# Patient Record
Sex: Female | Born: 1985 | Hispanic: Yes | Marital: Married | Smoking: Never smoker
Health system: Southern US, Community
[De-identification: ages and names within clinical notes are randomized; demographics above are authoritative.]

## PROBLEM LIST (undated history)

## (undated) ENCOUNTER — Inpatient Hospital Stay (HOSPITAL_COMMUNITY): Payer: Self-pay

## (undated) DIAGNOSIS — Z789 Other specified health status: Secondary | ICD-10-CM

## (undated) HISTORY — PX: NO PAST SURGERIES: SHX2092

---

## 2018-01-11 NOTE — L&D Delivery Note (Signed)
Delivery Note At 1:04 PM a viable and healthy female was delivered via Vaginal, Spontaneous (Presentation:vtx ;ROA  ).  APGAR: 9, 9; weight 6 lb 13.7 oz (3110 g).   Placenta status:spontaneous intact.( After 1 hr ), .  Cord:  with the following complications:  True knot x 1.  Cord pH: n/a  Anesthesia:  Epidural Episiotomy: None Lacerations:  Bilateral Vaginal;2nd degree perineal Suture Repair: 3.0 chromic Est. Blood Loss (mL): 240 cytotec 800 mcg PR given Methergine 0.2 mg IM for boggy uterus  Mom to postpartum.  Baby to Couplet care / Skin to Skin.  Felicita Nuncio A Shlomie Romig 09/26/2018, 8:01 PM

## 2018-02-15 ENCOUNTER — Encounter (HOSPITAL_COMMUNITY): Payer: Self-pay | Admitting: *Deleted

## 2018-02-15 ENCOUNTER — Inpatient Hospital Stay (HOSPITAL_COMMUNITY)
Admission: AD | Admit: 2018-02-15 | Discharge: 2018-02-15 | Disposition: A | Discharge status: Home / Self Care | Payer: Managed Care, Other (non HMO) | Attending: Obstetrics and Gynecology | Admitting: Obstetrics and Gynecology

## 2018-02-15 ENCOUNTER — Inpatient Hospital Stay (HOSPITAL_COMMUNITY): Payer: Managed Care, Other (non HMO)

## 2018-02-15 DIAGNOSIS — O208 Other hemorrhage in early pregnancy: Secondary | ICD-10-CM | POA: Diagnosis present

## 2018-02-15 DIAGNOSIS — Z3491 Encounter for supervision of normal pregnancy, unspecified, first trimester: Secondary | ICD-10-CM

## 2018-02-15 DIAGNOSIS — O468X1 Other antepartum hemorrhage, first trimester: Secondary | ICD-10-CM

## 2018-02-15 DIAGNOSIS — Z3A08 8 weeks gestation of pregnancy: Secondary | ICD-10-CM | POA: Diagnosis not present

## 2018-02-15 DIAGNOSIS — O26891 Other specified pregnancy related conditions, first trimester: Secondary | ICD-10-CM | POA: Diagnosis not present

## 2018-02-15 DIAGNOSIS — O418X10 Other specified disorders of amniotic fluid and membranes, first trimester, not applicable or unspecified: Secondary | ICD-10-CM | POA: Diagnosis not present

## 2018-02-15 DIAGNOSIS — O209 Hemorrhage in early pregnancy, unspecified: Secondary | ICD-10-CM

## 2018-02-15 DIAGNOSIS — Z7982 Long term (current) use of aspirin: Secondary | ICD-10-CM | POA: Diagnosis not present

## 2018-02-15 DIAGNOSIS — R102 Pelvic and perineal pain: Secondary | ICD-10-CM | POA: Diagnosis not present

## 2018-02-15 HISTORY — DX: Other specified health status: Z78.9

## 2018-02-15 LAB — URINALYSIS, ROUTINE W REFLEX MICROSCOPIC
Bilirubin Urine: NEGATIVE
Glucose, UA: NEGATIVE mg/dL
Ketones, ur: NEGATIVE mg/dL
Nitrite: NEGATIVE
Protein, ur: NEGATIVE mg/dL
SPECIFIC GRAVITY, URINE: 1.01 (ref 1.005–1.030)
pH: 5.5 (ref 5.0–8.0)

## 2018-02-15 LAB — URINALYSIS, MICROSCOPIC (REFLEX)

## 2018-02-15 LAB — POCT PREGNANCY, URINE: Preg Test, Ur: POSITIVE — AB

## 2018-02-15 LAB — CBC
HCT: 39.9 % (ref 36.0–46.0)
Hemoglobin: 14 g/dL (ref 12.0–15.0)
MCH: 31.5 pg (ref 26.0–34.0)
MCHC: 35.1 g/dL (ref 30.0–36.0)
MCV: 89.7 fL (ref 80.0–100.0)
Platelets: 213 10*3/uL (ref 150–400)
RBC: 4.45 MIL/uL (ref 3.87–5.11)
RDW: 12.2 % (ref 11.5–15.5)
WBC: 10.4 10*3/uL (ref 4.0–10.5)
nRBC: 0 % (ref 0.0–0.2)

## 2018-02-15 LAB — WET PREP, GENITAL
Clue Cells Wet Prep HPF POC: NONE SEEN
Sperm: NONE SEEN
Trich, Wet Prep: NONE SEEN
Yeast Wet Prep HPF POC: NONE SEEN

## 2018-02-15 LAB — HCG, QUANTITATIVE, PREGNANCY: hCG, Beta Chain, Quant, S: 92974 m[IU]/mL — ABNORMAL HIGH (ref <5)

## 2018-02-15 LAB — ABO/RH: ABO/RH(D): O POS

## 2018-02-15 NOTE — MAU Provider Note (Signed)
Chief Complaint: Possible Pregnancy and Vaginal Bleeding   None     SUBJECTIVE HPI: Diana Good is a 33 y.o. G1P0 at 4162w5d by LMP who presents to maternity admissions reporting vaginal bleeding with onset today. She reports the bleeding is red, in her underwear and when wiping but has not required a pad. There is no pain.  There are no other symptoms. She has not tried any treatments. She had 2 ultrasounds in [place], at 6818w3d and at 5378w3d, which she shows pictures of on her phone, indicating a live IUP.   HPI  Past Medical History:  Diagnosis Date  . Medical history non-contributory    Past Surgical History:  Procedure Laterality Date  . NO PAST SURGERIES     Social History   Socioeconomic History  . Marital status: Married    Spouse name: Not on file  . Number of children: Not on file  . Years of education: Not on file  . Highest education level: Not on file  Occupational History  . Not on file  Social Needs  . Financial resource strain: Not on file  . Food insecurity:    Worry: Not on file    Inability: Not on file  . Transportation needs:    Medical: Not on file    Non-medical: Not on file  Tobacco Use  . Smoking status: Never Smoker  . Smokeless tobacco: Never Used  Substance and Sexual Activity  . Alcohol use: Not Currently  . Drug use: Never  . Sexual activity: Yes  Lifestyle  . Physical activity:    Days per week: Not on file    Minutes per session: Not on file  . Stress: Not on file  Relationships  . Social connections:    Talks on phone: Not on file    Gets together: Not on file    Attends religious service: Not on file    Active member of club or organization: Not on file    Attends meetings of clubs or organizations: Not on file    Relationship status: Not on file  . Intimate partner violence:    Fear of current or ex partner: Not on file    Emotionally abused: Not on file    Physically abused: Not on file    Forced sexual activity:  Not on file  Other Topics Concern  . Not on file  Social History Narrative  . Not on file   No current facility-administered medications on file prior to encounter.    Current Outpatient Medications on File Prior to Encounter  Medication Sig Dispense Refill  . aspirin EC 81 MG tablet Take 81 mg by mouth daily.    . Prenatal Vit-Fe Fumarate-FA (PRENATAL MULTIVITAMIN) TABS tablet Take 1 tablet by mouth daily at 12 noon.    Marland Kitchen. PRESCRIPTION MEDICATION Take 1 tablet by mouth daily as needed (For nausea.).    Marland Kitchen. Progesterone Micronized (PROGESTERONE PO) Take 1 capsule by mouth at bedtime.     No Known Allergies  ROS:  Review of Systems   I have reviewed patient's Past Medical Hx, Surgical Hx, Family Hx, Social Hx, medications and allergies.   Physical Exam   Patient Vitals for the past 24 hrs:  BP Temp Temp src Pulse Resp SpO2 Height Weight  02/15/18 1128 (!) 109/58 98.4 F (36.9 C) Oral 77 15 99 % 5\' 3"  (1.6 m) 59.9 kg   Constitutional: Well-developed, well-nourished female in no acute distress.  Cardiovascular: normal rate Respiratory: normal  effort GI: Abd soft, non-tender. Pos BS x 4 MS: Extremities nontender, no edema, normal ROM Neurologic: Alert and oriented x 4.  GU: Neg CVAT.  PELVIC EXAM: vaginal swabs collected as blind swab, pad count with no bleeding on pad in MAU   LAB RESULTS Results for orders placed or performed during the hospital encounter of 02/15/18 (from the past 24 hour(s))  Pregnancy, urine POC     Status: Abnormal   Collection Time: 02/15/18 11:26 AM  Result Value Ref Range   Preg Test, Ur POSITIVE (A) NEGATIVE  Urinalysis, Routine w reflex microscopic     Status: Abnormal   Collection Time: 02/15/18 11:28 AM  Result Value Ref Range   Color, Urine YELLOW YELLOW   APPearance CLEAR CLEAR   Specific Gravity, Urine 1.010 1.005 - 1.030   pH 5.5 5.0 - 8.0   Glucose, UA NEGATIVE NEGATIVE mg/dL   Hgb urine dipstick LARGE (A) NEGATIVE   Bilirubin Urine  NEGATIVE NEGATIVE   Ketones, ur NEGATIVE NEGATIVE mg/dL   Protein, ur NEGATIVE NEGATIVE mg/dL   Nitrite NEGATIVE NEGATIVE   Leukocytes, UA MODERATE (A) NEGATIVE  Urinalysis, Microscopic (reflex)     Status: Abnormal   Collection Time: 02/15/18 11:28 AM  Result Value Ref Range   RBC / HPF 0-5 0 - 5 RBC/hpf   WBC, UA 11-20 0 - 5 WBC/hpf   Bacteria, UA FEW (A) NONE SEEN   Squamous Epithelial / LPF 0-5 0 - 5   Mucus PRESENT   CBC     Status: None   Collection Time: 02/15/18 12:00 PM  Result Value Ref Range   WBC 10.4 4.0 - 10.5 K/uL   RBC 4.45 3.87 - 5.11 MIL/uL   Hemoglobin 14.0 12.0 - 15.0 g/dL   HCT 67.8 93.8 - 10.1 %   MCV 89.7 80.0 - 100.0 fL   MCH 31.5 26.0 - 34.0 pg   MCHC 35.1 30.0 - 36.0 g/dL   RDW 75.1 02.5 - 85.2 %   Platelets 213 150 - 400 K/uL   nRBC 0.0 0.0 - 0.2 %  hCG, quantitative, pregnancy     Status: Abnormal   Collection Time: 02/15/18 12:00 PM  Result Value Ref Range   hCG, Beta Chain, Quant, S 92,974 (H) <5 mIU/mL  ABO/Rh     Status: None (Preliminary result)   Collection Time: 02/15/18 12:00 PM  Result Value Ref Range   ABO/RH(D)      O POS Performed at [facility], [address] [place], [place] [address]   Wet prep, genital     Status: Abnormal   Collection Time: 02/15/18 12:06 PM  Result Value Ref Range   Yeast Wet Prep HPF POC NONE SEEN NONE SEEN   Trich, Wet Prep NONE SEEN NONE SEEN   Clue Cells Wet Prep HPF POC NONE SEEN NONE SEEN   WBC, Wet Prep HPF POC MANY (A) NONE SEEN   Sperm NONE SEEN     --/--/O POS Performed at [facility], [address] [place], [place] [address]  (02/05 1200)  IMAGING US Ob Comp Less 14 Wks  Result Date: 02/15/2018 CLINICAL DATA:  Vaginal bleeding in early pregnancy EXAM: OBSTETRIC <14 WK ULTRASOUND TECHNIQUE: Transabdominal ultrasound was performed for evaluation of the gestation as well as the maternal uterus and adnexal regions. COMPARISON:  None. FINDINGS: Intrauterine gestational sac: Single  Yolk sac:  Visualized Embryo:  Visualized Cardiac Activity: Visualized Heart Rate: 169 bpm CRL:   22.4 mm   8  w 6 d                  [place] EDC: 09/21/2018 Subchorionic hemorrhage:  Small measuring 1.34 cm Maternal uterus/adnexae: Right ovary: Normal Left ovary: Normal Other :None Free fluid:  None IMPRESSION: 1. Single living intrauterine gestation with an estimated gestational age of [redacted] weeks and 6 days. This is concordant with the gestational age by last menstrual period. 2. Small subchorionic hemorrhage identified. Electronically Signed   By: Signa Kell M.D.   On: 02/15/2018 13:25    MAU Management/MDM: Orders Placed This Encounter  Procedures  . Wet prep, genital  . US OB Comp Less 14 Wks  . Urinalysis, Routine w reflex microscopic  . CBC  . hCG, quantitative, pregnancy  . Urinalysis, Microscopic (reflex)  . Nursing communication  . Pregnancy, urine POC  . ABO/Rh  . Discharge patient    No orders of the defined types were placed in this encounter.   US shows live IUP with small subchorionic hemorrhage.  Discussed results with pt and her husband. Questions answered. Pt plans to begin prenatal care with [facility]. Call for prenatal appointment to begin care.  Return to MAU with any heavy bleeding or abdominal pain.   Pt discharged with strict return precautions.  ASSESSMENT 1. Normal IUP (intrauterine pregnancy) on prenatal ultrasound, first trimester   2. Pelvic pain in pregnancy, antepartum, first trimester   3. Vaginal bleeding in pregnancy, first trimester   4. Subchorionic hemorrhage of placenta in first trimester, single or unspecified fetus     PLAN Discharge home Allergies as of 02/15/2018   No Known Allergies     Medication List    TAKE these medications   aspirin EC 81 MG tablet Take 81 mg by mouth daily.   prenatal multivitamin Tabs tablet Take 1 tablet by mouth daily at 12 noon.   PRESCRIPTION MEDICATION Take 1 tablet by mouth daily as needed (For  nausea.).   PROGESTERONE PO Take 1 capsule by mouth at bedtime.      Follow-up Information    [facility] Follow up.   Why:  Or prenatal provider of your choice, see list provided. Contact information: [address] [place] [address] [phone]           Sharen Counter Certified Nurse-Midwife 02/15/2018  1:51 PM

## 2018-02-15 NOTE — Discharge Instructions (Signed)
[facility] Providers  ° ° °[facility]       Phone: [phone] ° °[facility] Phone: [phone] ° °[facility]  Phone: [phone] ° °[facility]  Phone: [phone] ° °[facility]  Phone: [phone] ° °[facility]       Phone: [phone] ° °[facility]    Phone: [phone]  ° °[facility]    Phone: [phone] ° °[facility]    Phone: [phone] ° °[facility]    Phone: [phone] ° °[facility]    Phone: [phone] ° °[facility]       Phone: [phone]  ° °[facility]  Phone: [phone] ° °[facility]    Phone: [phone] ° °[facility]   Phone: [phone] ° °[facility]      Phone: [phone] ° °[facility]    Phone: [phone] ° °

## 2018-02-15 NOTE — MAU Note (Signed)
Pt reports she is pregnant, positive preg test in [place]. Has only been in the [place] for one week. Started having bleeding like a period today. Denies pain,.

## 2018-02-15 NOTE — MAU Note (Signed)
Pt states she had some spotting 3 weeks ago in [place], her dr gave her oral progesterone which she is still taking, has 6 doses left.

## 2018-02-16 LAB — GC/CHLAMYDIA PROBE AMP (~~LOC~~) NOT AT ARMC
Chlamydia: NEGATIVE
Neisseria Gonorrhea: NEGATIVE

## 2018-03-03 LAB — OB RESULTS CONSOLE GC/CHLAMYDIA
Chlamydia: NEGATIVE
Gonorrhea: NEGATIVE

## 2018-03-03 LAB — OB RESULTS CONSOLE RPR: RPR: NONREACTIVE

## 2018-03-03 LAB — OB RESULTS CONSOLE ANTIBODY SCREEN: Antibody Screen: NEGATIVE

## 2018-03-03 LAB — OB RESULTS CONSOLE RUBELLA ANTIBODY, IGM: Rubella: IMMUNE

## 2018-03-03 LAB — OB RESULTS CONSOLE HIV ANTIBODY (ROUTINE TESTING): HIV: NONREACTIVE

## 2018-03-03 LAB — OB RESULTS CONSOLE ABO/RH: RH Type: POSITIVE

## 2018-03-03 LAB — OB RESULTS CONSOLE HEPATITIS B SURFACE ANTIGEN: Hepatitis B Surface Ag: NEGATIVE

## 2018-09-01 LAB — OB RESULTS CONSOLE GBS: GBS: NEGATIVE

## 2018-09-21 ENCOUNTER — Telehealth (HOSPITAL_COMMUNITY): Payer: Self-pay | Admitting: *Deleted

## 2018-09-21 NOTE — Telephone Encounter (Signed)
Preadmission screen  

## 2018-09-22 ENCOUNTER — Telehealth (HOSPITAL_COMMUNITY): Payer: Self-pay | Admitting: *Deleted

## 2018-09-22 ENCOUNTER — Encounter (HOSPITAL_COMMUNITY): Payer: Self-pay | Admitting: *Deleted

## 2018-09-22 NOTE — Telephone Encounter (Signed)
Preadmission screen  

## 2018-09-25 ENCOUNTER — Other Ambulatory Visit: Payer: Self-pay

## 2018-09-25 ENCOUNTER — Other Ambulatory Visit: Payer: Self-pay | Admitting: Obstetrics

## 2018-09-25 ENCOUNTER — Encounter (HOSPITAL_COMMUNITY): Payer: Self-pay

## 2018-09-25 ENCOUNTER — Inpatient Hospital Stay (EMERGENCY_DEPARTMENT_HOSPITAL)
Admission: AD | Admit: 2018-09-25 | Discharge: 2018-09-25 | Disposition: A | Discharge status: Home / Self Care | Payer: Managed Care, Other (non HMO) | Source: Home / Self Care | Attending: Obstetrics and Gynecology | Admitting: Obstetrics and Gynecology

## 2018-09-25 DIAGNOSIS — Z3A40 40 weeks gestation of pregnancy: Secondary | ICD-10-CM | POA: Diagnosis not present

## 2018-09-25 DIAGNOSIS — O480 Post-term pregnancy: Secondary | ICD-10-CM

## 2018-09-25 DIAGNOSIS — O471 False labor at or after 37 completed weeks of gestation: Secondary | ICD-10-CM | POA: Diagnosis not present

## 2018-09-25 NOTE — MAU Note (Signed)
PT SAYS UC STARTED MN. PNC WITH DR [facility]-  TODAY VE - 3-4 CM.    DENIES HSV AND MRSA.  GBS-  NEG

## 2018-09-25 NOTE — MAU Note (Signed)
Patient scheduled for induction this Thursday 09/28/18.

## 2018-09-25 NOTE — MAU Note (Signed)
I have communicated with Sunday Corn, CNM and reviewed vital signs:  Vitals:   09/25/18 2042 09/25/18 2305  BP: 117/71 105/63  Pulse: 74 67  Resp: 20 17  Temp: 99.2 F (37.3 C) 98.4 F (36.9 C)  SpO2:  100%    Vaginal exam:  Dilation: 4 Effacement (%): 70, 80 Cervical Position: Middle Station: -1 Presentation: Vertex Exam by:: Denyse Dago, RN,   Also reviewed contraction pattern and that non-stress test is reactive.  It has been documented that patient is contracting every 2-5 minutes with no cervical change over 1 hours not indicating active labor.  Patient denies any other complaints.  Based on this report provider has given order for discharge.  A discharge order and diagnosis entered by a provider.   Labor discharge instructions reviewed with patient.

## 2018-09-25 NOTE — MAU Provider Note (Signed)
Ms. Diana Good is a G1P0 at [redacted]w[redacted]d seen in MAU for labor. RN labor check, not seen by provider.  SVE by RN Dilation: 4 Effacement (%): 70, 80 Cervical Position: Middle Station: -1 Presentation: Vertex Exam by:: Denyse Dago, RN  Unchanged are cervical recheck  NST - FHR: 145 bpm / moderate variability / accels present / decels absent / TOCO: regular every 3-4 mins   Assessment/Plan: 1. False Labor D/C home with labor precautions Keep scheduled appt for IOL on Thursday 09/28/2018  Laury Deep, CNM  09/25/2018 10:48 PM

## 2018-09-26 ENCOUNTER — Inpatient Hospital Stay (HOSPITAL_COMMUNITY): Payer: Managed Care, Other (non HMO) | Admitting: Anesthesiology

## 2018-09-26 ENCOUNTER — Other Ambulatory Visit (HOSPITAL_COMMUNITY)
Admission: RE | Admit: 2018-09-26 | Discharge: 2018-09-26 | Disposition: A | Discharge status: Home / Self Care | Payer: Managed Care, Other (non HMO) | Source: Ambulatory Visit

## 2018-09-26 ENCOUNTER — Encounter (HOSPITAL_COMMUNITY): Payer: Self-pay | Admitting: *Deleted

## 2018-09-26 ENCOUNTER — Inpatient Hospital Stay (HOSPITAL_COMMUNITY)
Admission: AD | Admit: 2018-09-26 | Discharge: 2018-09-28 | DRG: 805 | Disposition: A | Discharge status: Home / Self Care | Payer: Managed Care, Other (non HMO) | Attending: Obstetrics and Gynecology | Admitting: Obstetrics and Gynecology

## 2018-09-26 DIAGNOSIS — O411230 Chorioamnionitis, third trimester, not applicable or unspecified: Secondary | ICD-10-CM | POA: Diagnosis present

## 2018-09-26 DIAGNOSIS — O9081 Anemia of the puerperium: Secondary | ICD-10-CM | POA: Diagnosis not present

## 2018-09-26 DIAGNOSIS — O480 Post-term pregnancy: Principal | ICD-10-CM | POA: Diagnosis present

## 2018-09-26 DIAGNOSIS — Z3A40 40 weeks gestation of pregnancy: Secondary | ICD-10-CM | POA: Diagnosis not present

## 2018-09-26 DIAGNOSIS — Z20828 Contact with and (suspected) exposure to other viral communicable diseases: Secondary | ICD-10-CM | POA: Diagnosis present

## 2018-09-26 DIAGNOSIS — O471 False labor at or after 37 completed weeks of gestation: Secondary | ICD-10-CM

## 2018-09-26 LAB — CBC
HCT: 38.7 % (ref 36.0–46.0)
Hemoglobin: 13.4 g/dL (ref 12.0–15.0)
MCH: 30.7 pg (ref 26.0–34.0)
MCHC: 34.6 g/dL (ref 30.0–36.0)
MCV: 88.6 fL (ref 80.0–100.0)
Platelets: 181 10*3/uL (ref 150–400)
RBC: 4.37 MIL/uL (ref 3.87–5.11)
RDW: 14.2 % (ref 11.5–15.5)
WBC: 13.8 10*3/uL — ABNORMAL HIGH (ref 4.0–10.5)
nRBC: 0 % (ref 0.0–0.2)

## 2018-09-26 LAB — RPR: RPR Ser Ql: NONREACTIVE

## 2018-09-26 LAB — TYPE AND SCREEN
ABO/RH(D): O POS
Antibody Screen: NEGATIVE

## 2018-09-26 LAB — ABO/RH: ABO/RH(D): O POS

## 2018-09-26 LAB — SARS CORONAVIRUS 2 BY RT PCR (HOSPITAL ORDER, PERFORMED IN ~~LOC~~ HOSPITAL LAB): SARS Coronavirus 2: NEGATIVE

## 2018-09-26 MED ORDER — FENTANYL-BUPIVACAINE-NACL 0.5-0.125-0.9 MG/250ML-% EP SOLN
EPIDURAL | Status: AC
  Filled 2018-09-26: qty 250

## 2018-09-26 MED ORDER — TETANUS-DIPHTH-ACELL PERTUSSIS 5-2.5-18.5 LF-MCG/0.5 IM SUSP: 0.5000 mL | Freq: Once | INTRAMUSCULAR | Status: DC

## 2018-09-26 MED ORDER — ONDANSETRON HCL 4 MG/2ML IJ SOLN: 4.0000 mg | Freq: Four times a day (QID) | INTRAMUSCULAR | Status: DC | PRN

## 2018-09-26 MED ORDER — LACTATED RINGERS IV SOLN
INTRAVENOUS | Status: DC
  Administered 2018-09-26 (×2): via INTRAVENOUS

## 2018-09-26 MED ORDER — FENTANYL-BUPIVACAINE-NACL 0.5-0.125-0.9 MG/250ML-% EP SOLN: 12.0000 mL/h | EPIDURAL | Status: DC | PRN

## 2018-09-26 MED ORDER — DIPHENHYDRAMINE HCL 50 MG/ML IJ SOLN: 12.5000 mg | INTRAMUSCULAR | Status: DC | PRN

## 2018-09-26 MED ORDER — CLINDAMYCIN PHOSPHATE 900 MG/50ML IV SOLN
900.0000 mg | Freq: Three times a day (TID) | INTRAVENOUS | Status: DC
  Administered 2018-09-26 – 2018-09-27 (×3): 900 mg via INTRAVENOUS
  Filled 2018-09-26 (×5): qty 50

## 2018-09-26 MED ORDER — OXYTOCIN 10 UNIT/ML IJ SOLN: 10.0000 [IU] | Freq: Once | INTRAMUSCULAR | Status: DC

## 2018-09-26 MED ORDER — ACETAMINOPHEN 160 MG/5ML PO SOLN
650.0000 mg | ORAL | Status: DC | PRN
  Filled 2018-09-26: qty 20.3

## 2018-09-26 MED ORDER — OXYCODONE-ACETAMINOPHEN 5-325 MG PO TABS: 1.0000 | ORAL_TABLET | ORAL | Status: DC | PRN

## 2018-09-26 MED ORDER — GENTAMICIN SULFATE 40 MG/ML IJ SOLN
5.0000 mg/kg | INTRAVENOUS | Status: DC
  Administered 2018-09-26: 310 mg via INTRAVENOUS
  Filled 2018-09-26 (×2): qty 7.75

## 2018-09-26 MED ORDER — DIPHENHYDRAMINE HCL 25 MG PO CAPS: 25.0000 mg | ORAL_CAPSULE | Freq: Four times a day (QID) | ORAL | Status: DC | PRN

## 2018-09-26 MED ORDER — OXYTOCIN BOLUS FROM INFUSION
500.0000 mL | Freq: Once | INTRAVENOUS | Status: AC
  Administered 2018-09-26: 500 mL via INTRAVENOUS

## 2018-09-26 MED ORDER — PHENYLEPHRINE 40 MCG/ML (10ML) SYRINGE FOR IV PUSH (FOR BLOOD PRESSURE SUPPORT): 80.0000 ug | PREFILLED_SYRINGE | INTRAVENOUS | Status: DC | PRN

## 2018-09-26 MED ORDER — ACETAMINOPHEN 10 MG/ML IV SOLN
1000.0000 mg | Freq: Once | INTRAVENOUS | Status: AC
  Administered 2018-09-26: 1000 mg via INTRAVENOUS
  Filled 2018-09-26: qty 100

## 2018-09-26 MED ORDER — SODIUM CHLORIDE 0.9 % IV SOLN
2.0000 g | Freq: Four times a day (QID) | INTRAVENOUS | Status: DC
  Administered 2018-09-26 – 2018-09-27 (×4): 2 g via INTRAVENOUS
  Filled 2018-09-26 (×2): qty 2000
  Filled 2018-09-26: qty 2
  Filled 2018-09-26 (×3): qty 2000
  Filled 2018-09-26 (×2): qty 2

## 2018-09-26 MED ORDER — MISOPROSTOL 200 MCG PO TABS: 800.0000 ug | ORAL_TABLET | Freq: Once | ORAL | Status: DC

## 2018-09-26 MED ORDER — MISOPROSTOL 200 MCG PO TABS
ORAL_TABLET | ORAL | Status: AC
  Administered 2018-09-26: 14:00:00 800 ug
  Filled 2018-09-26: qty 4

## 2018-09-26 MED ORDER — DIBUCAINE (PERIANAL) 1 % EX OINT: 1.0000 "application " | TOPICAL_OINTMENT | CUTANEOUS | Status: DC | PRN

## 2018-09-26 MED ORDER — EPHEDRINE 5 MG/ML INJ: 10.0000 mg | INTRAVENOUS | Status: DC | PRN

## 2018-09-26 MED ORDER — OXYTOCIN 40 UNITS IN NORMAL SALINE INFUSION - SIMPLE MED
1.0000 m[IU]/min | INTRAVENOUS | Status: DC
  Administered 2018-09-26: 11:00:00 2 m[IU]/min via INTRAVENOUS
  Filled 2018-09-26: qty 1000

## 2018-09-26 MED ORDER — TERBUTALINE SULFATE 1 MG/ML IJ SOLN: 0.2500 mg | Freq: Once | INTRAMUSCULAR | Status: DC | PRN

## 2018-09-26 MED ORDER — METHYLERGONOVINE MALEATE 0.2 MG/ML IJ SOLN: 0.2000 mg | Freq: Once | INTRAMUSCULAR | Status: DC

## 2018-09-26 MED ORDER — SOD CITRATE-CITRIC ACID 500-334 MG/5ML PO SOLN: 30.0000 mL | ORAL | Status: DC | PRN

## 2018-09-26 MED ORDER — COCONUT OIL OIL: 1.0000 "application " | TOPICAL_OIL | Status: DC | PRN

## 2018-09-26 MED ORDER — SODIUM CHLORIDE (PF) 0.9 % IJ SOLN
INTRAMUSCULAR | Status: DC | PRN
  Administered 2018-09-26: 12 mL/h via EPIDURAL

## 2018-09-26 MED ORDER — ONDANSETRON HCL 4 MG/2ML IJ SOLN: 4.0000 mg | INTRAMUSCULAR | Status: DC | PRN

## 2018-09-26 MED ORDER — IBUPROFEN 100 MG/5ML PO SUSP
600.0000 mg | Freq: Four times a day (QID) | ORAL | Status: DC
  Administered 2018-09-26 – 2018-09-28 (×7): 600 mg via ORAL
  Filled 2018-09-26 (×9): qty 30

## 2018-09-26 MED ORDER — PRENATAL MULTIVITAMIN CH
1.0000 | ORAL_TABLET | Freq: Every day | ORAL | Status: DC
  Filled 2018-09-26: qty 1

## 2018-09-26 MED ORDER — METHYLERGONOVINE MALEATE 0.2 MG/ML IJ SOLN
INTRAMUSCULAR | Status: AC
  Administered 2018-09-26: 14:00:00 0.2 mg
  Filled 2018-09-26: qty 1

## 2018-09-26 MED ORDER — ACETAMINOPHEN 325 MG PO TABS
650.0000 mg | ORAL_TABLET | ORAL | Status: DC | PRN
  Filled 2018-09-26: qty 2

## 2018-09-26 MED ORDER — OXYCODONE-ACETAMINOPHEN 5-325 MG PO TABS: 2.0000 | ORAL_TABLET | ORAL | Status: DC | PRN

## 2018-09-26 MED ORDER — WITCH HAZEL-GLYCERIN EX PADS: 1.0000 "application " | MEDICATED_PAD | CUTANEOUS | Status: DC | PRN

## 2018-09-26 MED ORDER — SIMETHICONE 80 MG PO CHEW: 80.0000 mg | CHEWABLE_TABLET | ORAL | Status: DC | PRN

## 2018-09-26 MED ORDER — SENNOSIDES-DOCUSATE SODIUM 8.6-50 MG PO TABS
2.0000 | ORAL_TABLET | ORAL | Status: DC
  Administered 2018-09-27 (×2): 2 via ORAL
  Filled 2018-09-26 (×2): qty 2

## 2018-09-26 MED ORDER — ACETAMINOPHEN 160 MG/5ML PO SOLN: 650.0000 mg | ORAL | Status: DC | PRN

## 2018-09-26 MED ORDER — LIDOCAINE HCL (PF) 1 % IJ SOLN
INTRAMUSCULAR | Status: DC | PRN
  Administered 2018-09-26: 6 mL via EPIDURAL

## 2018-09-26 MED ORDER — OXYTOCIN 40 UNITS IN NORMAL SALINE INFUSION - SIMPLE MED: 2.5000 [IU]/h | INTRAVENOUS | Status: DC

## 2018-09-26 MED ORDER — LACTATED RINGERS IV SOLN
500.0000 mL | Freq: Once | INTRAVENOUS | Status: AC
  Administered 2018-09-26: 500 mL via INTRAVENOUS

## 2018-09-26 MED ORDER — BENZOCAINE-MENTHOL 20-0.5 % EX AERO: 1.0000 "application " | INHALATION_SPRAY | CUTANEOUS | Status: DC | PRN

## 2018-09-26 MED ORDER — LACTATED RINGERS IV SOLN: 500.0000 mL | INTRAVENOUS | Status: DC | PRN

## 2018-09-26 MED ORDER — ONDANSETRON HCL 4 MG PO TABS: 4.0000 mg | ORAL_TABLET | ORAL | Status: DC | PRN

## 2018-09-26 MED ORDER — LIDOCAINE HCL (PF) 1 % IJ SOLN: 30.0000 mL | INTRAMUSCULAR | Status: DC | PRN

## 2018-09-26 NOTE — Progress Notes (Signed)
S:  Comfortable  O: BP (!) 97/51   Pulse 64   Temp 98.9 F (37.2 C) (Oral)   Resp 18   Ht 5\' 4"  (1.626 m)   Wt 71.4 kg   LMP 12/16/2017   BMI 27.02 kg/m   Pitocin 4 miu VE per RN 8/100/-1 LOP AROM clear fluid Tracing: baseline 130 (+) accel 160 Ctx q 6-7 mins  IMP: active phase Postdates  P) right exaggerated sims. Cont pitocin

## 2018-09-26 NOTE — Anesthesia Postprocedure Evaluation (Signed)
Anesthesia Post Note  Patient: Market researcher  Procedure(s) Performed: AN AD [place]     Patient location during evaluation: Mother Baby Anesthesia Type: Epidural Level of consciousness: awake and alert Pain management: pain level controlled Vital Signs Assessment: post-procedure vital signs reviewed and stable Respiratory status: spontaneous breathing, nonlabored ventilation and respiratory function stable Cardiovascular status: stable Postop Assessment: no headache, no backache and epidural receding Anesthetic complications: no    Last Vitals:  Vitals:   09/26/18 1643 09/26/18 1745  BP: 109/70 (!) 104/57  Pulse: 73 98  Resp: 18 20  Temp: 37.8 C 37.4 C  SpO2:  98%    Last Pain:  Vitals:   09/26/18 1747  TempSrc:   PainSc: 2                  Forrest Demuro

## 2018-09-26 NOTE — Anesthesia Procedure Notes (Signed)
Epidural Patient location during procedure: OB Start time: 09/26/2018 6:51 AM End time: 09/26/2018 6:55 AM  Staffing Anesthesiologist: Janeece Riggers, MD  Preanesthetic Checklist Completed: patient identified, site marked, surgical consent, pre-op evaluation, timeout performed, IV checked, risks and benefits discussed and monitors and equipment checked  Epidural Patient position: sitting Prep: site prepped and draped and DuraPrep Patient monitoring: continuous pulse ox and blood pressure Approach: midline Location: L3-L4 Injection technique: LOR air  Needle:  Needle type: Tuohy  Needle gauge: 17 G Needle length: 9 cm and 9 Needle insertion depth: 5 cm cm Catheter type: closed end flexible Catheter size: 19 Gauge Catheter at skin depth: 10 cm Test dose: negative  Assessment Events: blood not aspirated, injection not painful, no injection resistance, negative IV test and no paresthesia

## 2018-09-26 NOTE — H&P (Signed)
Diana Good is a 33 y.o. female presenting @ 40 4/7 wks in active labor. Intact membrane. GBS cx neg OB History    Gravida  1   Para      Term      Preterm      AB      Living        SAB      TAB      Ectopic      Multiple      Live Births             Past Medical History:  Diagnosis Date  . Medical history non-contributory    Past Surgical History:  Procedure Laterality Date  . NO PAST SURGERIES     Family History: family history includes Cancer in her maternal grandfather. Social History:  reports that she has never smoked. She has never used smokeless tobacco. She reports previous alcohol use. She reports that she does not use drugs.     Maternal Diabetes: No Genetic Screening: Declined Maternal Ultrasounds/Referrals: Normal Fetal Ultrasounds or other Referrals:  None Maternal Substance Abuse:  No Significant Maternal Medications:  None Significant Maternal Lab Results:  Group B Strep negative Other Comments:  None  Review of Systems  All other systems reviewed and are negative.  History Dilation: 6 Effacement (%): 80 Station: -1 Exam by:: CJ, RN Blood pressure 115/73, pulse (!) 108, temperature 98.4 F (36.9 C), temperature source Oral, resp. rate 20, height 5\' 4"  (1.626 m), weight 71.4 kg, last menstrual period 12/16/2017. Exam Physical Exam  Constitutional: She is oriented to person, place, and time. She appears well-nourished.  Eyes: EOM are normal.  Neck: Neck supple.  Cardiovascular: Regular rhythm.  Respiratory: Breath sounds normal.  Musculoskeletal:        General: Edema present.  Neurological: She is alert and oriented to person, place, and time.  Skin: Skin is warm and dry.  Psychiatric: She has a normal mood and affect.    Prenatal labs: ABO, Rh: O/Positive/-- (02/21 0000) Antibody: Negative (02/21 0000) Rubella: Immune (02/21 0000) RPR: Nonreactive (02/21 0000)  HBsAg: Negative (02/21 0000)  HIV:  Non-reactive (02/21 0000)  GBS: Negative/-- (08/21 0000)   Assessment/Plan: Active labor Post dates P) admit routine labs. Epidural. Amniotomy prn. Pitocin augmentation prn   Dione Mccombie A Lutisha Knoche 09/26/2018, 6:16 AM

## 2018-09-26 NOTE — Progress Notes (Signed)
Temp 102 after delivery. Adherent placenta

## 2018-09-26 NOTE — Anesthesia Preprocedure Evaluation (Signed)

## 2018-09-27 LAB — CBC
HCT: 27.4 % — ABNORMAL LOW (ref 36.0–46.0)
Hemoglobin: 9.6 g/dL — ABNORMAL LOW (ref 12.0–15.0)
MCH: 31.1 pg (ref 26.0–34.0)
MCHC: 35 g/dL (ref 30.0–36.0)
MCV: 88.7 fL (ref 80.0–100.0)
Platelets: 118 10*3/uL — ABNORMAL LOW (ref 150–400)
RBC: 3.09 MIL/uL — ABNORMAL LOW (ref 3.87–5.11)
RDW: 14.6 % (ref 11.5–15.5)
WBC: 12.7 10*3/uL — ABNORMAL HIGH (ref 4.0–10.5)
nRBC: 0 % (ref 0.0–0.2)

## 2018-09-27 MED ORDER — GENTAMICIN SULFATE 40 MG/ML IJ SOLN: 5.0000 mg/kg | INTRAVENOUS | Status: DC

## 2018-09-27 NOTE — Lactation Note (Signed)
This note was copied from a baby's chart. Lactation Consultation Note  Patient Name: Diana Good [id number] Date: 09/27/2018 Reason for consult: Initial assessment;1st time breastfeeding;Term P1, 1 hour female infant. When [facility] entered room mom was doing STS with infant. Infant had two stools since birth. Per mom, infant latched in L&D for 5 minutes and twice in room for 10 minutes. This is mom's 4th time latching infant to breast. Mom taught back hand expression and colostrum present both breast mom is semi short shafted but breast responds well breast stimulation not tools given due infant latching well to breast without difficulty.  Mom latched infant on right breast using football hold, infant open mouth with wide gape, nose and chin touching breast and infant breastfeed for 15 minutes. When infant came off breast mom's nipples were well rounded. Mom knows to breastfeed infant according hunger cues, 8 to 12 times within 24 hours and on demand. Mom knows to ask Nurse or [place] if she has any questions, concerns or need assistance with latching infant to breast. Reviewed Baby & Me book's Breastfeeding Basics.  Mom made aware of O/P services, breastfeeding support groups, community resources, and our phone # for post-discharge questions.  Maternal Data Formula Feeding for Exclusion: No Has patient been taught Hand Expression?: Yes(Mom has colostrum present both breast.) Does the patient have breastfeeding experience prior to this delivery?: No  Feeding Feeding Type: Breast Fed  LATCH Score Latch: Grasps breast easily, tongue down, lips flanged, rhythmical sucking.  Audible Swallowing: Spontaneous and intermittent  Type of Nipple: Everted at rest and after stimulation(mom is little short shafted.)  Comfort (Breast/Nipple): Soft / non-tender  Hold (Positioning): Assistance needed to correctly position infant at breast and maintain latch.  LATCH Score:  9  Interventions Interventions: Breast feeding basics reviewed;Breast compression;Assisted with latch;Adjust position;Support pillows;Skin to skin;Breast massage;Position options;Hand express;Expressed milk  Lactation Tools Discussed/Used WIC Program: No   Consult Status Consult Status: Follow-up Date: 09/27/18 Follow-up type: In-patient    Vicente Serene 09/27/2018, 12:35 AM

## 2018-09-27 NOTE — Progress Notes (Signed)
No c/o; normal lochia, minimal cramping; tol po, voiding w/o difficulty Denies f/c/s  Temp:  [97.9 F (36.6 C)-102 F (38.9 C)] 97.9 F (36.6 C) (09/16 0104) Pulse Rate:  [53-213] 53 (09/16 0539) Resp:  [18-20] 18 (09/16 0539) BP: (90-139)/(51-89) 90/62 (09/16 0539) SpO2:  [98 %-100 %] 100 % (09/16 0539)   A&ox3 rrr ctab Abd: soft, nt, nd; fundus firm and below umb 2cm LE: no edema, nt bilat  CBC Latest Ref Rng & Units 09/27/2018 09/26/2018 02/15/2018  WBC 4.0 - 10.5 K/uL 12.7(H) 13.8(H) 10.4  Hemoglobin 12.0 - 15.0 g/dL 9.6(L) 13.4 14.0  Hematocrit 36.0 - 46.0 % 27.4(L) 38.7 39.9  Platelets 150 - 400 K/uL 118(L) 181 213   A/P: ppd 1 s/p svd, manual extraction of placenta 1. Doing well, contin care 2. chorio - fever just after delivery - plan abx x24 hrs; pt afebrile since 3. Acute anemia: plan iron pp

## 2018-09-27 NOTE — Lactation Note (Signed)
This note was copied from a baby's chart. Lactation Consultation Note:  LC was paged to mothers room to assist with latching infant.  Mother sitting up in the chair in a laid back position. She was having difficulty with rousing infant for feeding.   Assist mother with pillow support and placed infant in cross cradle hold.  Reviewed hand expression . Observed good flow of colostrum.  infant latched on using a tea-cup hold. Infant began suckling in a good steady rhythmic pattern. Reviewed latch technique with mother. Father at side for support.  Parents taught to listen for swallowing . Mother taught breast compression.  Infant sustained latch for 25 mins.  Offered to assist mother with placing infant in football hold.  Mother reports ok for now.  Lots of teaching on basics with parents.  Advised to continue to page for latch assistance as needed.  Encouraged to do frequent STS.    Patient Name: Diana Good [id number] Date: 09/27/2018 Reason for consult: Follow-up assessment   Maternal Data    Feeding Feeding Type: Breast Fed  LATCH Score Latch: Grasps breast easily, tongue down, lips flanged, rhythmical sucking.  Audible Swallowing: A few with stimulation  Type of Nipple: Everted at rest and after stimulation  Comfort (Breast/Nipple): Soft / non-tender  Hold (Positioning): Assistance needed to correctly position infant at breast and maintain latch.  LATCH Score: 8  Interventions Interventions: Breast feeding basics reviewed;Assisted with latch;Skin to skin;Breast massage;Hand express;Breast compression;Adjust position;Support pillows;Position options;Expressed milk  Lactation Tools Discussed/Used     Consult Status Consult Status: Follow-up Date: 09/28/18 Follow-up type: In-patient    Diana Good [facility] 09/27/2018, 4:11 PM

## 2018-09-28 ENCOUNTER — Inpatient Hospital Stay (HOSPITAL_COMMUNITY): Payer: Self-pay

## 2018-09-28 ENCOUNTER — Inpatient Hospital Stay (HOSPITAL_COMMUNITY): Admission: AD | Admit: 2018-09-28 | Payer: Self-pay | Source: Home / Self Care

## 2018-09-28 MED ORDER — POLYETHYLENE GLYCOL 3350 17 G PO PACK: 17.0000 g | PACK | Freq: Every day | ORAL | 0 refills | Status: AC | PRN

## 2018-09-28 MED ORDER — IBUPROFEN 100 MG/5ML PO SUSP: 600.0000 mg | Freq: Four times a day (QID) | ORAL | 1 refills | Status: DC

## 2018-09-28 MED ORDER — INFLUENZA VAC SPLIT QUAD 0.5 ML IM SUSY: 0.5000 mL | PREFILLED_SYRINGE | INTRAMUSCULAR | Status: DC

## 2018-09-28 MED ORDER — PRENATAL 19 29-1 MG PO CHEW
1.0000 | CHEWABLE_TABLET | Freq: Every day | ORAL | 4 refills | Status: AC
Start: 2018-09-28 — End: ?

## 2018-09-28 NOTE — Lactation Note (Signed)
This note was copied from a baby's chart. Lactation Consultation Note  Patient Name: Girl Tymika Grilli HENID'P Date: 09/28/2018 Reason for consult: Follow-up assessment;Difficult latch;Primapara;1st time breastfeeding;Infant weight loss;Term;Nipple pain/trauma  LC in to visit with P1 Mom and term baby on day of discharge.  Baby at 7.9% weight loss.  Output WNL.  Baby "cluster fed" all night.  Mom complaining of sore nipples (RN to provide coconut oil)  Baby had just fed for 15 mins, and is fussy and cueing.   On oral assessment, baby has a very high palate, and suspected posterior short frenulum.  Baby unable to cup finger and retracts her tongue to back of mouth.  Baby's mouth noted to be dry on exam.  Mom's nipples erect and areola compressible.  No visible trauma noted on nipples.    Watched as Mom was latching baby in cradle hold.  Offered to assist. Breast massage and hand expression reviewed, colostrum drops easily expressed, but Mom is feeling discomfort, nipples pink. Baby unable to sustain a deep latch, baby latching and becoming fussy and popping on and off.  Mom states this is how baby has been feeding all night for the most part.  Tried cross cradle and football holds.  Initiated a 20 mm nipple shield, instructing Mom how to properly attach to breast.  Nipple pulls well into shield.  Baby still unable to sustain a deep latch.  No colostrum noted in shield after 10 mins on and off the breast.  Talked to Mom about baby having difficult time transferring milk from breast at this time.    Set up DEBP (Mom has a Spectra DEBP at home), and assisted Mom with her first pumping.  Mom collected 2 ml colostrum which was spoon fed to baby.  Mom taught how to disassemble pump parts and other tools used, wash, rinse and air dry in separate bin.  Taught FOB how to cup feed baby using formula (as a bridge until milk volume increases).  Baby took 10 ml formula and is contented  sleeping in FOB's arms.  Engorgement prevention and treatment reviewed.  Plan- 1- STS as much as possible 2- Offer breast with cues.  Use nipple shield prn 3- Offer baby 10-20 ml EBM+/formula by cup, increasing volume as tolerated. 4- Pump both breast 15-20 mins, using breast massage and hand expression as well. 5- follow-up with OP lactation appointment.    Mom states baby will be going to [facility] in [place].  This practice has an IBCLC, otherwise recommend she call for OP appointment at [facility]. Mom knows to ask for assistance prn.  Interventions Interventions: Breast feeding basics reviewed;Assisted with latch;Skin to skin;Breast massage;Hand express;Breast compression;Adjust position;Support pillows;Position options;Expressed milk;Coconut oil;Hand pump;DEBP  Lactation Tools Discussed/Used Tools: Pump;Nipple Jefferson Fuel;Feeding cup Nipple shield size: 20 Breast pump type: Double-Electric Breast Pump WIC Program: No Pump Review: Setup, frequency, and cleaning;Milk Storage Initiated by:: Cipriano Mile RN IBCLC Date initiated:: 09/28/18   Consult Status Consult Status: Follow-up Date: 09/29/18 Follow-up type: In-patient    Broadus John 09/28/2018, 11:03 AM

## 2018-09-28 NOTE — Lactation Note (Signed)
This note was copied from a baby's chart. Lactation Consultation Note  Patient Name: Diana Good Date: 09/28/2018 Reason for consult: Follow-up assessment  P1 mother whose infant is now 25 hours old.  This is a term infant with a 9% weight loss.    I was asked by NP to follow up with a feeding to determine baby's ability to feed prior to discharge.  Discussed plan with parents earlier today and they have called for assistance.    Placed a 5 Fr feeding tube at mother's breast.  Demonstrated proper hand and finger positioning for mother as she latched baby to the right breast in the football hold.  Baby had wide gape and flanged lips upon latching.  Demonstrated gently pushing the syringe to begin formula flow and baby started sucking.  She began rhythmically sucking and, at times, was using full jaw movements to move the formula down the syringe.  She paced well and had no emesis or leaking of formula from her mouth.  Demonstrated gentle stimulation for parents to use when she becomes sleepy at the breast.  After consuming 12 mls I demonstrated effective burping and mother latched again after burping.  Parents demonstrated the second syringe of formula to be sure they had no difficulties with this method.  Mother latched better with minimal assistance.  Reminded mother to keep baby deep into breast tissue during feedings and to keep fingers away from mouth and nose while doing breast compressions.  Infant took an additional 17 mls before tiring.  Mother burped well.    Parents feel confident that they can continue this plan at home tonight.  Asked them to awaken baby every three hours to feed throughout the night.  Mother will pump for 15 minutes after feedings and give back any EBM she obtains to baby.  They have a return pediatric visit tomorrow and their pediatrician's office has a Science writer on site.  Mother already has an appointment made for tomorrow's  consult.    Parents have our OP phone number for questions/concerns after discharge.  Fanny Dance, NP, notified of progress and RN updated.  Discharge order entered by NP.     Maternal Data    Feeding    LATCH Score                   Interventions    Lactation Tools Discussed/Used     Consult Status Consult Status: Complete Date: 09/28/18 Follow-up type: Call as needed    Diana Good 09/28/2018, 7:12 PM

## 2018-09-28 NOTE — Discharge Instructions (Signed)
°  TAKE chewable prenatal TWICE daily x 2 weeks then daily while breastfeeding up to 1 year

## 2018-09-28 NOTE — Progress Notes (Signed)
Postpartum Day # 2   Delivering provider: COUSINS, SHERONETTE  Pain control at delivery: Epidural  Episiotomy:None  Lacerations:Vaginal;2nd degree;Perineal   Live born female  Birth Weight: 6 lb 13.7 oz (3110 g) APGAR: 9, 9  Newborn Delivery   Birth date/time: 09/26/2018 13:04:00 Delivery type: Vaginal, Spontaneous      Baby name: Auri Feeding: breast  S:  Reports feeling well, ready to be d/c today.             Tolerating po/ No nausea or vomiting             Bleeding is light             Pain controlled. No routine meds needed.             Up ad lib / ambulatory / voiding without difficulties   O:  A & O x 3, in no apparent distress              VS:  Vitals:   09/27/18 0539 09/27/18 1519 09/27/18 2149 09/28/18 0524  BP: 90/62 (!) 80/47 96/61 (!) 105/53  Pulse: (!) 53 95 63 (!) 51  Resp: 18 16 16 18   Temp:  98.5 F (36.9 C) 98.5 F (36.9 C) 98.2 F (36.8 C)  TempSrc:  Oral Oral   SpO2: 100% 99% 99%   Weight:      Height:        LABS:  Recent Labs    09/26/18 0604 09/27/18 0501  WBC 13.8* 12.7*  HGB 13.4 9.6*  HCT 38.7 27.4*  PLT 181 118*    Blood type: --/--/O POS, O POS Performed at [facility], [place] [address], [place], [place] [address]  [phone])  Rubella: Immune (02/21 0000)   I&O: No intake/output data recorded.          No intake/output data recorded.  Vaccines: TDaP UTD         Flu    Ordered   Abdomen: soft, non-tender, non-distended             Fundus: firm, non-tender, U-1  Perineum: Mild edema  Lochia: Light bleeding noted  Extremities: No edema, no calf pain or tenderness    A/P: PPD # 2 33 y.o., G1P1001    Principal Problem: Postpartum care following vaginal delivery (9/15)  Doing well - stable status  PO Iron, Mag Ox, Rx Motrin  Routine post partum orders  Anticipate discharge today   Juanna Cao, SNM, MSN 09/28/2018, 8:09 AM

## 2018-09-28 NOTE — Lactation Note (Signed)
This note was copied from a baby's chart. Lactation Consultation Note  Patient Name: Diana Good HAFBX'U Date: 09/28/2018 Reason for consult: Follow-up assessment  P1 mother whose infant is now 57 hours old.  This is a term infant with a 9% weight loss.  Parents are anticipating discharge this evening.  Baby has had some difficulty latching and transferring EBM.  Mother is familiar with hand expression and worked extensively with [facility]ner this a.m.  NP and I discussed the feeding issue.  I will observe/assist with the next feeding.  Mother feels like baby is latching but not staying awake and feeding for long intervals.  Sometimes she cannot keep baby latched to the breast.  Mother does not wish to use a NS.  Parents have started supplementing with the cup feeder.  Since baby just cup fed 20 mls of Similac at 1530 she is not showing any feeding cues.  When mother observes cues she plans to contact her RN and I will return to assess the next feeding.  Showed parents the option of using the 5 Fr. Feeding tube at the breast if desired.  They are showing some interest in this; will determine if this is needed at the next feeding.  Update given to Diana Dance, NP.  I will call her after the next feeding to determine discharge.  RN updated.   Maternal Data    Feeding Feeding Type: Breast Fed  LATCH Score                   Interventions    Lactation Tools Discussed/Used     Consult Status Consult Status: Follow-up Date: 09/28/18 Follow-up type: In-patient    Diana Good 09/28/2018, 5:28 PM

## 2018-09-28 NOTE — Progress Notes (Signed)
CSW received consult due to score 10 on Edinburgh Depression Screen.    CSW congratulated MOB on the birth of infant. MOB was advised of CSW's role and the reason for CSW coming to visit with her. MOB reported that she has been going through a lot. MOB began to cry as FOB consoled her. CSW encouraged MOB to take the time she needed to express feelings as tears can say a lot. MOB was able to inform CSW that she was in labor since Thursday and was seen at the hospital on different occasions in which MOB was sent home.MOB reported that on her last visit to the hospitial she as so afraid as she thought that staff would send her back home and she didn't want to return home because she knew she was in labor. MOB reported that once she was in labor, it went well overall but shebeagn to lok at MD and RN's faces after giving birth and this scared her. MOB reported "I wasn't abel tot even look at my baby because I was looking at them and I was worried". MOB also reported that breastfeeding has been a challenge for her. CSW encouraged MOB to not get discouraged as breast feeding can take some time. MOB expressed that staff have been very helpful in helping her adjust to new things. MOB reported that she still finds herself worrying at times and repots that "this is just me". CSW asked MOB if she has ever been on any medications for anxiety or depression and MOB reported that she hasn't and denies ever having anxiety pr depression. MOB reported that worrying for her occurred before she was pregnant and increased throughout her pregnancy. MOB expressed to CSW that she tries to control everything and this is the cause of most of her worry as things usually do not go as planned. CSW encouraged MOB to go with th flow. MOB was advised that caring for infant is new and will take patience. MOB reported that she understood and felt better after speaking with CSW.    CSW provided education regarding Baby Blues vs PMADs and provided  MOB with resources for mental health follow up.  CSW encouraged MOB to evaluate her mental health throughout the postpartum period with the use of the New Mom Checklist developed by Postpartum [employer] as well as the Edinburgh Postnatal Depression Scale and notify a medical professional if symptoms arise.    MOB expressed having all needed items to care for infant once arrived home.      Riyaan Heroux S. Saida Lonon, MSW, LCSW [facility] [phone]   

## 2018-09-28 NOTE — Discharge Summary (Signed)
Obstetric Discharge Summary Reason for Admission: onset of labor Prenatal Procedures: none Intrapartum Procedures: spontaneous vaginal delivery and epidural Postpartum Procedures: none Complications-Operative and Postpartum: 2nd degree perineal laceration Hemoglobin  Date Value Ref Range Status  09/27/2018 9.6 (L) 12.0 - 15.0 g/dL Final    Comment:    REPEATED TO VERIFY DELTA CHECK NOTED    HCT  Date Value Ref Range Status  09/27/2018 27.4 (L) 36.0 - 46.0 % Final    Physical Exam:  General: alert, cooperative and no distress Lochia: appropriate Uterine Fundus: firm Incision: healing well DVT Evaluation: No evidence of DVT seen on physical exam.  Discharge Diagnoses: Term Pregnancy-delivered  Discharge Information: Date: 09/28/2018 Activity: pelvic rest Diet: routine Medications: PNV chewable x 2 for 2 weeks then daily / Motrin suspension / MOM or Miralax for constipation Condition: stable Instructions: refer to practice specific booklet Discharge to: home Follow-up Information    Servando Salina, MD. Schedule an appointment as soon as possible for a visit in 6 week(s).   Specialty: Obstetrics and Gynecology Contact information: [address] [place] [address] [phone]           Newborn Data: Live born female  Birth Weight: 6 lb 13.7 oz (3110 g) APGAR: 66, 9  Newborn Delivery   Birth date/time: 09/26/2016 13:04:00 Delivery type: Vaginal, Spontaneous      Home with mother.  Diana Good 09/28/2018, 9:53 AM

## 2019-01-26 ENCOUNTER — Encounter (HOSPITAL_COMMUNITY): Payer: Self-pay | Admitting: Obstetrics and Gynecology

## 2020-02-11 ENCOUNTER — Other Ambulatory Visit: Payer: Self-pay | Admitting: Obstetrics

## 2020-02-11 DIAGNOSIS — N6321 Unspecified lump in the left breast, upper outer quadrant: Secondary | ICD-10-CM

## 2020-02-25 ENCOUNTER — Ambulatory Visit
Admission: RE | Admit: 2020-02-25 | Discharge: 2020-02-25 | Disposition: A | Discharge status: Home / Self Care | Payer: Managed Care, Other (non HMO) | Source: Ambulatory Visit | Attending: Obstetrics | Admitting: Obstetrics

## 2020-02-25 ENCOUNTER — Other Ambulatory Visit: Payer: Self-pay

## 2020-02-25 ENCOUNTER — Other Ambulatory Visit: Payer: Self-pay | Admitting: Obstetrics

## 2020-02-25 DIAGNOSIS — N6321 Unspecified lump in the left breast, upper outer quadrant: Secondary | ICD-10-CM

## 2020-08-25 ENCOUNTER — Ambulatory Visit
Admission: RE | Admit: 2020-08-25 | Discharge: 2020-08-25 | Disposition: A | Discharge status: Home / Self Care | Payer: Managed Care, Other (non HMO) | Source: Ambulatory Visit | Attending: Obstetrics | Admitting: Obstetrics

## 2020-08-25 ENCOUNTER — Other Ambulatory Visit: Payer: Self-pay

## 2020-08-25 DIAGNOSIS — N6321 Unspecified lump in the left breast, upper outer quadrant: Secondary | ICD-10-CM

## 2021-01-13 ENCOUNTER — Other Ambulatory Visit: Payer: Self-pay | Admitting: Obstetrics

## 2021-01-13 DIAGNOSIS — Z09 Encounter for follow-up examination after completed treatment for conditions other than malignant neoplasm: Secondary | ICD-10-CM

## 2021-01-13 DIAGNOSIS — R921 Mammographic calcification found on diagnostic imaging of breast: Secondary | ICD-10-CM

## 2021-02-27 ENCOUNTER — Ambulatory Visit
Admission: RE | Admit: 2021-02-27 | Discharge: 2021-02-27 | Disposition: A | Discharge status: Home / Self Care | Payer: 59 | Source: Ambulatory Visit | Attending: Obstetrics | Admitting: Obstetrics

## 2021-02-27 ENCOUNTER — Other Ambulatory Visit: Payer: Self-pay

## 2021-02-27 DIAGNOSIS — R921 Mammographic calcification found on diagnostic imaging of breast: Secondary | ICD-10-CM

## 2021-02-27 DIAGNOSIS — Z09 Encounter for follow-up examination after completed treatment for conditions other than malignant neoplasm: Secondary | ICD-10-CM

## 2022-02-04 ENCOUNTER — Other Ambulatory Visit: Payer: Self-pay | Admitting: Obstetrics

## 2022-02-04 DIAGNOSIS — R928 Other abnormal and inconclusive findings on diagnostic imaging of breast: Secondary | ICD-10-CM

## 2022-03-25 ENCOUNTER — Ambulatory Visit
Admission: RE | Admit: 2022-03-25 | Discharge: 2022-03-25 | Disposition: A | Discharge status: Home / Self Care | Payer: Managed Care, Other (non HMO) | Source: Ambulatory Visit | Attending: Obstetrics | Admitting: Obstetrics

## 2022-03-25 DIAGNOSIS — R928 Other abnormal and inconclusive findings on diagnostic imaging of breast: Secondary | ICD-10-CM

## 2022-10-06 LAB — OB RESULTS CONSOLE HIV ANTIBODY (ROUTINE TESTING): HIV: NONREACTIVE

## 2022-10-06 LAB — OB RESULTS CONSOLE GC/CHLAMYDIA
Chlamydia: NEGATIVE
Neisseria Gonorrhea: NEGATIVE

## 2022-10-06 LAB — OB RESULTS CONSOLE RUBELLA ANTIBODY, IGM: Rubella: IMMUNE

## 2022-10-06 LAB — OB RESULTS CONSOLE HEPATITIS B SURFACE ANTIGEN: Hepatitis B Surface Ag: NEGATIVE

## 2022-10-06 LAB — OB RESULTS CONSOLE RPR: RPR: NONREACTIVE

## 2023-01-12 NOTE — L&D Delivery Note (Signed)
 Delivery Note At 9:28 PM a viable and healthy female was delivered via Vaginal, Spontaneous (Presentation: ROA).  APGAR: 8, 9; weight pending .   Placenta status: Spontaneous, Intact.  Cord: 3 vessels with the following complications: None.  Cord pH: n/a  Anesthesia: Epidural Episiotomy: None Lacerations: None Suture Repair:  n/a Est. Blood Loss (mL):  75 cc  Mom to postpartum.  Baby to Couplet care / Skin to Skin.  Roisin Mones A Yamaris Cummings 05/03/2023, 9:44 PM

## 2023-04-07 LAB — OB RESULTS CONSOLE GBS: GBS: NEGATIVE

## 2023-05-03 ENCOUNTER — Inpatient Hospital Stay (HOSPITAL_COMMUNITY): Admitting: Anesthesiology

## 2023-05-03 ENCOUNTER — Encounter (HOSPITAL_COMMUNITY): Payer: Self-pay

## 2023-05-03 ENCOUNTER — Other Ambulatory Visit: Payer: Self-pay

## 2023-05-03 ENCOUNTER — Inpatient Hospital Stay (HOSPITAL_COMMUNITY)
Admission: RE | Admit: 2023-05-03 | Discharge: 2023-05-05 | DRG: 807 | Disposition: A | Discharge status: Home / Self Care | Payer: Self-pay | Attending: Obstetrics and Gynecology | Admitting: Obstetrics and Gynecology

## 2023-05-03 DIAGNOSIS — O26893 Other specified pregnancy related conditions, third trimester: Secondary | ICD-10-CM | POA: Diagnosis present

## 2023-05-03 DIAGNOSIS — Z3A40 40 weeks gestation of pregnancy: Secondary | ICD-10-CM | POA: Diagnosis not present

## 2023-05-03 DIAGNOSIS — Z603 Acculturation difficulty: Secondary | ICD-10-CM | POA: Diagnosis present

## 2023-05-03 DIAGNOSIS — Z3490 Encounter for supervision of normal pregnancy, unspecified, unspecified trimester: Principal | ICD-10-CM

## 2023-05-03 LAB — CBC
HCT: 36.3 % (ref 36.0–46.0)
Hemoglobin: 12.7 g/dL (ref 12.0–15.0)
MCH: 31.2 pg (ref 26.0–34.0)
MCHC: 35 g/dL (ref 30.0–36.0)
MCV: 89.2 fL (ref 80.0–100.0)
Platelets: 212 10*3/uL (ref 150–400)
RBC: 4.07 MIL/uL (ref 3.87–5.11)
RDW: 13.3 % (ref 11.5–15.5)
WBC: 8.5 10*3/uL (ref 4.0–10.5)
nRBC: 0 % (ref 0.0–0.2)

## 2023-05-03 LAB — TYPE AND SCREEN
ABO/RH(D): O POS
Antibody Screen: NEGATIVE

## 2023-05-03 LAB — HIV ANTIBODY (ROUTINE TESTING W REFLEX): HIV Screen 4th Generation wRfx: NONREACTIVE

## 2023-05-03 MED ORDER — LIDOCAINE HCL (PF) 1 % IJ SOLN
INTRAMUSCULAR | Status: DC | PRN
  Administered 2023-05-03: 11 mL via EPIDURAL
  Administered 2023-05-03 (×2): 5 mL via EPIDURAL

## 2023-05-03 MED ORDER — DIPHENHYDRAMINE HCL 50 MG/ML IJ SOLN: 12.5000 mg | INTRAMUSCULAR | Status: DC | PRN

## 2023-05-03 MED ORDER — WITCH HAZEL-GLYCERIN EX PADS: 1.0000 | MEDICATED_PAD | CUTANEOUS | Status: DC | PRN

## 2023-05-03 MED ORDER — IBUPROFEN 600 MG PO TABS
600.0000 mg | ORAL_TABLET | Freq: Four times a day (QID) | ORAL | Status: DC
  Administered 2023-05-04 – 2023-05-05 (×6): 600 mg via ORAL
  Filled 2023-05-03 (×6): qty 1

## 2023-05-03 MED ORDER — BENZOCAINE-MENTHOL 20-0.5 % EX AERO: 1.0000 | INHALATION_SPRAY | CUTANEOUS | Status: DC | PRN

## 2023-05-03 MED ORDER — LACTATED RINGERS IV SOLN: 500.0000 mL | Freq: Once | INTRAVENOUS | Status: AC

## 2023-05-03 MED ORDER — OXYTOCIN-SODIUM CHLORIDE 30-0.9 UT/500ML-% IV SOLN
2.5000 [IU]/h | INTRAVENOUS | Status: DC
  Filled 2023-05-03: qty 500

## 2023-05-03 MED ORDER — ONDANSETRON HCL 4 MG/2ML IJ SOLN: 4.0000 mg | INTRAMUSCULAR | Status: DC | PRN

## 2023-05-03 MED ORDER — TETANUS-DIPHTH-ACELL PERTUSSIS 5-2.5-18.5 LF-MCG/0.5 IM SUSY: 0.5000 mL | PREFILLED_SYRINGE | Freq: Once | INTRAMUSCULAR | Status: DC

## 2023-05-03 MED ORDER — LACTATED RINGERS IV SOLN: INTRAVENOUS | Status: DC

## 2023-05-03 MED ORDER — OXYCODONE-ACETAMINOPHEN 5-325 MG PO TABS: 2.0000 | ORAL_TABLET | ORAL | Status: DC | PRN

## 2023-05-03 MED ORDER — PHENYLEPHRINE 80 MCG/ML (10ML) SYRINGE FOR IV PUSH (FOR BLOOD PRESSURE SUPPORT): 80.0000 ug | PREFILLED_SYRINGE | INTRAVENOUS | Status: DC | PRN

## 2023-05-03 MED ORDER — ACETAMINOPHEN 325 MG PO TABS: 650.0000 mg | ORAL_TABLET | ORAL | Status: DC | PRN

## 2023-05-03 MED ORDER — SOD CITRATE-CITRIC ACID 500-334 MG/5ML PO SOLN: 30.0000 mL | ORAL | Status: DC | PRN

## 2023-05-03 MED ORDER — SENNOSIDES-DOCUSATE SODIUM 8.6-50 MG PO TABS
2.0000 | ORAL_TABLET | Freq: Every day | ORAL | Status: DC
  Administered 2023-05-04 – 2023-05-05 (×2): 2 via ORAL
  Filled 2023-05-03 (×2): qty 2

## 2023-05-03 MED ORDER — EPHEDRINE 5 MG/ML INJ: 10.0000 mg | INTRAVENOUS | Status: DC | PRN

## 2023-05-03 MED ORDER — SIMETHICONE 80 MG PO CHEW: 80.0000 mg | CHEWABLE_TABLET | ORAL | Status: DC | PRN

## 2023-05-03 MED ORDER — DIBUCAINE (PERIANAL) 1 % EX OINT: 1.0000 | TOPICAL_OINTMENT | CUTANEOUS | Status: DC | PRN

## 2023-05-03 MED ORDER — DIPHENHYDRAMINE HCL 25 MG PO CAPS: 25.0000 mg | ORAL_CAPSULE | Freq: Four times a day (QID) | ORAL | Status: DC | PRN

## 2023-05-03 MED ORDER — COCONUT OIL OIL: 1.0000 | TOPICAL_OIL | Status: DC | PRN

## 2023-05-03 MED ORDER — OXYCODONE-ACETAMINOPHEN 5-325 MG PO TABS: 1.0000 | ORAL_TABLET | ORAL | Status: DC | PRN

## 2023-05-03 MED ORDER — ONDANSETRON HCL 4 MG/2ML IJ SOLN: 4.0000 mg | Freq: Four times a day (QID) | INTRAMUSCULAR | Status: DC | PRN

## 2023-05-03 MED ORDER — ZOLPIDEM TARTRATE 5 MG PO TABS: 5.0000 mg | ORAL_TABLET | Freq: Every evening | ORAL | Status: DC | PRN

## 2023-05-03 MED ORDER — FENTANYL-BUPIVACAINE-NACL 0.5-0.125-0.9 MG/250ML-% EP SOLN
12.0000 mL/h | EPIDURAL | Status: DC | PRN
  Administered 2023-05-03: 12 mL/h via EPIDURAL
  Filled 2023-05-03: qty 250

## 2023-05-03 MED ORDER — LACTATED RINGERS IV SOLN
500.0000 mL | INTRAVENOUS | Status: DC | PRN
  Administered 2023-05-03: 1000 mL via INTRAVENOUS

## 2023-05-03 MED ORDER — LIDOCAINE HCL (PF) 1 % IJ SOLN: 30.0000 mL | INTRAMUSCULAR | Status: DC | PRN

## 2023-05-03 MED ORDER — OXYTOCIN BOLUS FROM INFUSION
333.0000 mL | Freq: Once | INTRAVENOUS | Status: AC
  Administered 2023-05-03: 333 mL via INTRAVENOUS

## 2023-05-03 MED ORDER — ONDANSETRON HCL 4 MG PO TABS: 4.0000 mg | ORAL_TABLET | ORAL | Status: DC | PRN

## 2023-05-03 MED ORDER — PRENATAL MULTIVITAMIN CH
1.0000 | ORAL_TABLET | Freq: Every day | ORAL | Status: DC
  Administered 2023-05-04 – 2023-05-05 (×2): 1 via ORAL
  Filled 2023-05-03 (×2): qty 1

## 2023-05-03 NOTE — Anesthesia Procedure Notes (Signed)
 Epidural Patient location during procedure: OB Start time: 05/03/2023 5:00 PM End time: 05/03/2023 5:14 PM  Staffing Anesthesiologist: Earvin Goldberg, MD Performed: anesthesiologist   Preanesthetic Checklist Completed: patient identified, IV checked, site marked, risks and benefits discussed, surgical consent, monitors and equipment checked, pre-op evaluation and timeout performed  Epidural Patient position: sitting Prep: ChloraPrep Patient monitoring: heart rate, cardiac monitor, continuous pulse ox and blood pressure Approach: midline Location: L2-L3 Injection technique: LOR saline  Needle:  Needle type: Tuohy  Needle gauge: 17 G Needle length: 9 cm Needle insertion depth: 4 cm Catheter type: closed end flexible Catheter size: 20 Guage Catheter at skin depth: 8 cm Test dose: negative  Assessment Events: blood not aspirated, injection not painful, no injection resistance, no paresthesia and negative IV test  Additional Notes Reason for block:procedure for pain

## 2023-05-03 NOTE — Anesthesia Preprocedure Evaluation (Signed)
 Anesthesia Evaluation  Patient identified by MRN, date of birth, ID band Patient awake    Reviewed: Allergy & Precautions, H&P , NPO status , Patient's Chart, lab work & pertinent test results, reviewed documented beta blocker date and time   Airway Mallampati: II  TM Distance: >3 FB Neck ROM: full    Dental no notable dental hx.    Pulmonary neg pulmonary ROS   Pulmonary exam normal breath sounds clear to auscultation       Cardiovascular negative cardio ROS Normal cardiovascular exam Rhythm:regular Rate:Normal     Neuro/Psych negative neurological ROS  negative psych ROS   GI/Hepatic negative GI ROS, Neg liver ROS,,,  Endo/Other  negative endocrine ROS    Renal/GU negative Renal ROS  negative genitourinary   Musculoskeletal   Abdominal   Peds  Hematology negative hematology ROS (+)   Anesthesia Other Findings   Reproductive/Obstetrics (+) Pregnancy                              Anesthesia Physical Anesthesia Plan  ASA: II  Anesthesia Plan: Epidural   Post-op Pain Management:    Induction:   PONV Risk Score and Plan:   Airway Management Planned:   Additional Equipment:   Intra-op Plan:   Post-operative Plan:   Informed Consent: I have reviewed the patients History and Physical, chart, labs and discussed the procedure including the risks, benefits and alternatives for the proposed anesthesia with the patient or authorized representative who has indicated his/her understanding and acceptance.       Plan Discussed with:   Anesthesia Plan Comments:          Anesthesia Quick Evaluation

## 2023-05-03 NOTE — Anesthesia Procedure Notes (Signed)
 Epidural Patient location during procedure: OB Start time: 05/03/2023 6:58 PM End time: 05/03/2023 7:07 PM  Staffing Anesthesiologist: Tura Gaines, MD Performed: anesthesiologist   Preanesthetic Checklist Completed: patient identified, IV checked, site marked, risks and benefits discussed, surgical consent, monitors and equipment checked, pre-op evaluation and timeout performed  Epidural Patient position: sitting Prep: DuraPrep and site prepped and draped Patient monitoring: continuous pulse ox and blood pressure Approach: midline Location: L3-L4 Injection technique: LOR air  Needle:  Needle type: Tuohy  Needle gauge: 17 G Needle length: 9 cm and 9 Needle insertion depth: 4 cm Catheter type: closed end flexible Catheter size: 19 Gauge Catheter at skin depth: 9 cm Test dose: negative and Other  Assessment Events: blood not aspirated, no cerebrospinal fluid, injection not painful, no injection resistance, no paresthesia and negative IV test  Additional Notes Patient identified. Risks and benefits discussed including failed block, incomplete  Pain control, post dural puncture headache, nerve damage, paralysis, blood pressure Changes, nausea, vomiting, reactions to medications-both toxic and allergic and post Partum back pain. All questions were answered. Patient expressed understanding and wished to proceed. Sterile technique was used throughout procedure. Epidural site was Dressed with sterile barrier dressing. No paresthesias, signs of intravascular injection Or signs of intrathecal spread were encountered.  Patient was more comfortable after the epidural was dosed. Please see RN's note for documentation of vital signs and FHR which are stable. Reason for block:procedure for pain

## 2023-05-03 NOTE — H&P (Signed)
 Diana Good is a 38 y.o. female G2P1001 [redacted]w[redacted]d presenting for active labor  Patient reports feeling comfortable, some BH CTXs today. Seen in office and found to be 5 cm dilation. No LOF or VB. Good FM throughout. Sent for direct admission in latent labor.  Prenatal care at [facility]. Pregnancy dated by LMP c/w 8w sono. Femara OI conception. She had a normal prenatal lab panel and a low risk NIPT. Normal fetal anatomy scan. Normal 1hr GTT 90 and 11.7 at third trimester screening. Most recent sono performed at 34 weeks showed vertex presentation with an EFW 5#9 (57%) and anterior placenta. GBS screen negative. History of 1 prior SVD pelvis proven to 6# uncomplicated.  Patient accompanied by her husband "[name]" and they are expecting a baby girl "[name]"  OB History     Gravida  2   Para  1   Term  1   Preterm      AB      Living  1      SAB      IAB      Ectopic      Multiple  0   Live Births  1          Past Medical History:  Diagnosis Date   Medical history non-contributory    Past Surgical History:  Procedure Laterality Date   NO PAST SURGERIES     Family History: family history includes Breast cancer in her paternal aunt; Cancer in her maternal grandfather. Social History:  reports that she has never smoked. She has never used smokeless tobacco. She reports that she does not currently use alcohol. She reports that she does not use drugs.     Maternal Diabetes: No Genetic Screening: Normal Maternal Ultrasounds/Referrals: Normal Fetal Ultrasounds or other Referrals:  None Maternal Substance Abuse:  No Significant Maternal Medications:  None Significant Maternal Lab Results:  Group B Strep negative Other Comments:  None  Review of Systems  All other systems reviewed and are negative.  Per HPI Maternal Exam:  Uterine Assessment: Contraction frequency is irregular.  Abdomen: Estimated fetal weight is 7#0.   Fetal presentation:  vertex Introitus: Normal vulva. Normal vagina.  Amniotic fluid character: not assessed. Pelvis: adequate for delivery.     Fetal Exam Fetal State Assessment: Category I - tracings are normal.   Physical Exam Vitals reviewed.  Constitutional:      Appearance: She is normal weight.  Cardiovascular:     Rate and Rhythm: Normal rate.  Pulmonary:     Effort: Pulmonary effort is normal.  Abdominal:     Tenderness: There is no abdominal tenderness.  Genitourinary:    General: Normal vulva.  Skin:    General: Skin is warm and dry.  Neurological:     General: No focal deficit present.     Mental Status: She is alert and oriented to person, place, and time.  Psychiatric:        Mood and Affect: Mood normal.        Behavior: Behavior normal.       Height 5\' 3"  (1.6 m), weight 71.3 kg, last menstrual period 07/27/2022, currently breastfeeding.  Prenatal labs: ABO, Rh:    Antibody:   Rubella: Immune (09/25 0000) RPR: Nonreactive (09/25 0000)  HBsAg: Negative (09/25 0000)  HIV: Non-reactive (09/25 0000)  GBS: Negative/-- (03/27 0000)   ChemistryNo results for input(s): "NA", "K", "CL", "CO2", "GLUCOSE", "BUN", "CREATININE", "CALCIUM", "GFRNONAA", "GFRAA", "ANIONGAP" in the last 168 hours.  No results for input(s): "PROT", "ALBUMIN", "AST", "ALT", "ALKPHOS", "BILITOT" in the last 168 hours. HematologyNo results for input(s): "WBC", "RBC", "HGB", "HCT", "MCV", "MCH", "MCHC", "RDW", "PLT" in the last 168 hours. Cardiac EnzymesNo results for input(s): "TROPONINI" in the last 168 hours. No results for input(s): "TROPIPOC" in the last 168 hours.  BNPNo results for input(s): "BNP", "PROBNP" in the last 168 hours.  DDimer No results for input(s): "DDIMER" in the last 168 hours.   Assessment/Plan: Diana Good is a 38 y.o. female G2P1001 [redacted]w[redacted]d admitted with latent labor.   -Admission to LD -Routine admission labs -Latent labor: consented for AROM--scant amount clear  fluid -Cont EFM/Toco -GBS NEG -Epidural in place -Routine intrapartum care -Anticipate SVD  Adriana Lina A Mai Longnecker 05/03/2023, 3:59 PM

## 2023-05-04 LAB — CBC
HCT: 33.9 % — ABNORMAL LOW (ref 36.0–46.0)
Hemoglobin: 11.8 g/dL — ABNORMAL LOW (ref 12.0–15.0)
MCH: 31.6 pg (ref 26.0–34.0)
MCHC: 34.8 g/dL (ref 30.0–36.0)
MCV: 90.6 fL (ref 80.0–100.0)
Platelets: 157 10*3/uL (ref 150–400)
RBC: 3.74 MIL/uL — ABNORMAL LOW (ref 3.87–5.11)
RDW: 13.4 % (ref 11.5–15.5)
WBC: 10 10*3/uL (ref 4.0–10.5)
nRBC: 0 % (ref 0.0–0.2)

## 2023-05-04 LAB — RPR: RPR Ser Ql: NONREACTIVE

## 2023-05-04 NOTE — Lactation Note (Signed)
 This note was copied from a baby's chart. Lactation Consultation Note  Patient Name: Diana Good [id number] Date: 05/04/2023 Age:36 hours Reason for consult: Term;Initial assessment  P2, 40 wks, @ 17 hrs of life. Video interpreter used- Anabel # [id number]. Mom experienced breast feeder. Mom has baby swaddled @ breast "sleepy"- encouraged unwrapping and waking baby to start. Demonstrated steps of latching. Infant sleepy- reassured  couplet doing well. Wait an hour if needed and try again. Even hand expression onto a spoon and feeding off spoon great idea- baby belly size 5 ml/ spoon sized today.  Discussed expectations @ breast- Day 1- sleepy/ feed every 3 hrs/ even 10 minutes is okay, Day 2 more awake/ feeding cues/longer feeds, and cluster feeding overnights brings milk in. Highlighted breast stimulation is tied directly to milk production. Discussed hands on breast and baby, keeping baby awake @ breast. Starting with hand expression & breast compression to get baby working @ breast, and gentle stimulation to keep baby working @ breast. Encouraged EBM or coconut oil for nipple care post feed. LC services and milk storage shared. Encouraged mom to call for assist anytime desired.  Maternal Data Has patient been taught Hand Expression?: Yes Does the patient have breastfeeding experience prior to this delivery?: Yes How long did the patient breastfeed?: 1 1/2 + years  Feeding Mother's Current Feeding Choice: Breast Milk   Interventions Interventions: Breast feeding basics reviewed;Hand express;Breast compression;Expressed milk;Coconut oil;Education;LC Services brochure;CDC milk storage guidelines  Discharge Pump: DEBP;Manual;Hands Free;Personal  Consult Status Consult Status: Follow-up Date: 05/05/23 Follow-up type: In-patient    [facility] 05/04/2023, 2:48 PM

## 2023-05-04 NOTE — Anesthesia Postprocedure Evaluation (Signed)
 Anesthesia Post Note  Patient: Restaurant manager, [employer]  Procedure(s) Performed: AN AD HOC LABOR EPIDURAL     Patient location during evaluation: Mother Baby Anesthesia Type: Epidural Level of consciousness: awake and alert Pain management: pain level controlled Vital Signs Assessment: post-procedure vital signs reviewed and stable Respiratory status: spontaneous breathing, nonlabored ventilation and respiratory function stable Cardiovascular status: stable Postop Assessment: no headache, no backache and epidural receding Anesthetic complications: no   No notable events documented.  Last Vitals:  Vitals:   05/04/23 0430 05/04/23 0825  BP: (!) 89/53 96/62  Pulse: (!) 49 (!) 52  Resp: 18 18  Temp: 36.9 C 36.8 C  SpO2: 98% 99%    Last Pain:  Vitals:   05/04/23 0825  TempSrc: Oral  PainSc: 0-No pain   Pain Goal:                   Sabrina Crandall

## 2023-05-04 NOTE — Progress Notes (Signed)
Ordered pt meals by Saydie Gerdts Spanish Medical Interpreter. 

## 2023-05-04 NOTE — Progress Notes (Signed)
 Post Partum Day One Subjective: no complaints. Pain well controlled. VB is light. Ambulating without dizziness. Spontaneously voiding without difficulties. Tolerating regular diet. No HA, CP, or SOB.  Baby girl doing well at bedside working on BF  Objective: Patient Vitals for the past 24 hrs:  BP Temp Temp src Pulse Resp SpO2 Height Weight  05/04/23 0825 96/62 98.2 F (36.8 C) Oral (!) 52 18 99 % -- --  05/04/23 0430 (!) 89/53 98.4 F (36.9 C) Oral (!) 49 18 98 % -- --  05/04/23 0031 (!) 87/62 98.2 F (36.8 C) Oral 62 18 98 % -- --  05/03/23 2321 103/68 98.6 F (37 C) Oral 77 18 96 % -- --  05/03/23 2300 108/60 -- -- (!) 56 -- -- -- --  05/03/23 2230 108/85 -- -- (!) 55 -- -- -- --  05/03/23 2215 115/60 -- -- (!) 59 -- -- -- --  05/03/23 2200 113/62 -- -- 63 -- -- -- --  05/03/23 2145 116/67 -- -- 68 -- -- -- --  05/03/23 2130 110/79 -- -- 100 -- -- -- --  05/03/23 2100 (!) 104/54 -- -- 63 -- -- -- --  05/03/23 2030 (!) 101/58 -- -- 72 -- -- -- --  05/03/23 2000 109/64 -- -- 67 -- -- -- --  05/03/23 1940 112/66 -- -- 64 -- 97 % -- --  05/03/23 1935 (!) 118/57 -- -- 65 -- 98 % -- --  05/03/23 1930 118/66 -- -- 66 -- 97 % -- --  05/03/23 1925 112/71 -- -- 68 -- 97 % -- --  05/03/23 1920 114/70 (!) 97.5 F (36.4 C) Oral 76 -- 97 % -- --  05/03/23 1915 106/63 -- -- 82 19 96 % -- --  05/03/23 1910 120/66 -- -- 73 -- 97 % -- --  05/03/23 1909 -- -- -- -- -- 97 % -- --  05/03/23 1905 119/77 -- -- 91 -- 97 % -- --  05/03/23 1902 115/73 -- -- 81 -- -- -- --  05/03/23 1900 -- -- -- -- -- 98 % -- --  05/03/23 1855 -- -- -- -- -- 96 % -- --  05/03/23 1850 -- -- -- -- -- 98 % -- --  05/03/23 1845 -- -- -- -- -- 95 % -- --  05/03/23 1840 -- -- -- -- -- 97 % -- --  05/03/23 1839 -- -- -- -- -- 97 % -- --  05/03/23 1835 -- -- -- -- -- 96 % -- --  05/03/23 1830 116/68 -- -- 74 16 97 % -- --  05/03/23 1825 -- -- -- -- -- 97 % -- --  05/03/23 1820 -- -- -- -- -- 95 % -- --  05/03/23 1815  -- -- -- -- -- 96 % -- --  05/03/23 1810 -- -- -- -- -- 96 % -- --  05/03/23 1807 -- -- -- -- -- 96 % -- --  05/03/23 1805 -- -- -- -- -- 96 % -- --  05/03/23 1800 118/75 -- -- 75 17 97 % -- --  05/03/23 1755 -- -- -- -- -- 95 % -- --  05/03/23 1750 -- -- -- -- -- 95 % -- --  05/03/23 1745 111/70 -- -- 75 17 96 % -- --  05/03/23 1740 112/85 -- -- 88 -- 98 % -- --  05/03/23 1735 130/77 -- -- 87 -- 99 % -- --  05/03/23 1730 117/86 -- -- 79 -- 98 % -- --  05/03/23 1725 113/69 -- -- 67 16 98 % -- --  05/03/23 1720 112/67 -- -- 68 -- 97 % -- --  05/03/23 1715 109/73 -- -- 75 -- 99 % -- --  05/03/23 1711 120/75 -- -- 76 -- 98 % -- --  05/03/23 1708 120/79 -- -- 89 17 -- -- --  05/03/23 1706 -- -- -- -- -- 99 % -- --  05/03/23 1550 106/73 97.6 F (36.4 C) Oral 93 17 100 % -- --  05/03/23 1535 -- -- -- -- -- -- 5\' 3"  (1.6 m) 71.3 kg    Physical Exam:  General: alert and no distress Lochia: appropriate Uterine Fundus: firm DVT Evaluation: No evidence of DVT seen on physical exam.  Recent Labs    05/03/23 1607 05/04/23 0439  WBC 8.5 10.0  HGB 12.7 11.8*  HCT 36.3 33.9*  PLT 212 157    No results for input(s): "NA", "K", "CL", "CO2CT", "BUN", "CREATININE", "GLUCOSE", "BILITOT", "ALT", "AST", "ALKPHOS", "PROT", "ALBUMIN" in the last 72 hours.  No results for input(s): "CALCIUM", "MG", "PHOS" in the last 72 hours.  No results for input(s): "PROTIME", "APTT", "INR" in the last 72 hours.  No results for input(s): "PROTIME", "APTT", "INR", "FIBRINOGEN" in the last 72 hours. Assessment/Plan: Plan for discharge tomorrow  Diana Good 37 y.o. G2P2002 PPD#1 sp NSVD at 40.0 1. PPC: cont routine PO pain regimen, regular diet, encourage ambulation 2. RH POS, RI 3. LC support PRN  Anticipate DC home tomorrow PPD2   LOS: 1 day   Diana Good A Diana Good 05/04/2023, 11:30 AM

## 2023-05-05 LAB — BIRTH TISSUE RECOVERY COLLECTION (PLACENTA DONATION)

## 2023-05-05 MED ORDER — IBUPROFEN 600 MG PO TABS: 600.0000 mg | ORAL_TABLET | Freq: Four times a day (QID) | ORAL | 0 refills | Status: AC

## 2023-05-05 NOTE — Discharge Summary (Signed)
 Postpartum Discharge Summary  Date of Service updated     Patient Name: Diana Good DOB: 1985/11/19 MRN: [id number]  Date of admission: 05/03/2023 Delivery date:05/03/2023 Delivering provider: Richard Champion A Date of discharge: 05/05/2023  Admitting diagnosis: Pregnant [Z34.90] Intrauterine pregnancy: [redacted]w[redacted]d     Secondary diagnosis:  Principal Problem:   Pregnant  Additional problems: none    Discharge diagnosis: Term Pregnancy Delivered                                              Augmentation: AROM Complications: None  Hospital course: Onset of Labor With Vaginal Delivery      38 y.o. yo [id number] at [redacted]w[redacted]d was admitted in Latent Labor on 05/03/2023. Labor course was complicated by nothing.  Membrane Rupture Time/Date: 5:34 PM,05/03/2023  Delivery Method:Vaginal, Spontaneous Episiotomy: None Lacerations:  None Patient had a postpartum course complicated by nothing.  She is ambulating, tolerating a regular diet, passing flatus, and urinating well. Patient is discharged home in stable condition on 05/05/23.  Newborn Data: Birth date:05/03/2023 Birth time:9:28 PM Gender:Female Living status:Living Apgars:8 ,9  Weight:3230 g   There is no immunization history for the selected administration types on file for this patient.  Physical exam  Vitals:   05/04/23 0825 05/04/23 1225 05/04/23 2146 05/05/23 0520  BP: 96/62 105/70 (!) 95/58 (!) 98/56  Pulse: (!) 52 (!) 59 62 60  Resp: 18 18 18 18   Temp: 98.2 F (36.8 C) 98.1 F (36.7 C) 98.3 F (36.8 C) 98.1 F (36.7 C)  TempSrc: Oral Oral Oral Oral  SpO2: 99% 100% 98% 98%  Weight:      Height:        Labs: Lab Results  Component Value Date   WBC 10.0 05/04/2023   HGB 11.8 (L) 05/04/2023   HCT 33.9 (L) 05/04/2023   MCV 90.6 05/04/2023   PLT 157 05/04/2023       No data to display         [place] Score:    05/04/2023    8:25 AM  Edinburgh Postnatal Depression Scale Screening Tool  I have  been able to laugh and see the funny side of things. 0  I have looked forward with enjoyment to things. 0  I have blamed myself unnecessarily when things went wrong. 1  I have been anxious or worried for no good reason. 0  I have felt scared or panicky for no good reason. 0  Things have been getting on top of me. 0  I have been so unhappy that I have had difficulty sleeping. 0  I have felt sad or miserable. 0  I have been so unhappy that I have been crying. 0  The thought of harming myself has occurred to me. 0  Edinburgh Postnatal Depression Scale Total 1      After visit meds:  Allergies as of 05/05/2023   No Known Allergies      Medication List     STOP taking these medications    ibuprofen  100 MG/5ML suspension Commonly known as: ADVIL  Replaced by: ibuprofen  600 MG tablet       TAKE these medications    famotidine 20 MG tablet Commonly known as: PEPCID Take 20 mg by mouth as needed for heartburn or indigestion.   ibuprofen  600 MG tablet Commonly known as: ADVIL  Take 1 tablet (  600 mg total) by mouth every 6 (six) hours. Replaces: ibuprofen  100 MG/5ML suspension   polyethylene glycol 17 g packet Commonly known as: MIRALAX  / GLYCOLAX  Take 17 g by mouth daily as needed for moderate constipation (no BM in 3 days).   Prenatal 19 29-1 MG Chew Chew 1 tablet by mouth daily.         Discharge home in stable condition Infant Feeding: Breast Infant Disposition:home with mother Discharge instruction: per After Visit Summary and Postpartum booklet. Activity: Advance as tolerated. Pelvic rest for 6 weeks.  Diet: routine diet Anticipated Birth Control: Unsure Postpartum Appointment:6 weeks Future Appointments:No future appointments. Follow up Visit:  Follow-up Information     Lukus Binion, Burlene Carpen, MD Follow up.   Specialty: Obstetrics and Gynecology Contact information: [address] [place] [address] [phone]                      05/05/2023 Zora Hires, MD

## 2023-05-05 NOTE — Progress Notes (Signed)
 No c/o, nml lochia, breastfeeding Voids w/o difficulty  Patient Vitals for the past 24 hrs:  BP Temp Temp src Pulse Resp SpO2  05/05/23 0520 (!) 98/56 98.1 F (36.7 C) Oral 60 18 98 %  05/04/23 2146 (!) 95/58 98.3 F (36.8 C) Oral 62 18 98 %  05/04/23 1225 105/70 98.1 F (36.7 C) Oral (!) 59 18 100 %   A&ox3 Nml respirations Abd: soft,nt,nd, fundus firm and below umb LE: no edema,nt bilat     Latest Ref Rng & Units 05/04/2023    4:39 AM 05/03/2023    4:07 PM 09/27/2018    5:01 AM  CBC  WBC 4.0 - 10.5 K/uL 10.0  8.5  12.7   Hemoglobin 12.0 - 15.0 g/dL 16.1  09.6  9.6   Hematocrit 36.0 - 46.0 % 33.9  36.3  27.4   Platelets 150 - 400 K/uL 157  212  118    A/P: ppd2 s/p svd Doing well, d/c home Rh pos Rubella immune Girl, breastfeeding

## 2023-05-13 ENCOUNTER — Telehealth (HOSPITAL_COMMUNITY): Payer: Self-pay | Admitting: *Deleted

## 2023-05-13 NOTE — Telephone Encounter (Signed)
 05/13/2023  Name: Diana Good MRN: [id number] DOB: [birth year]  Reason for Call:  Transition of Care Hospital Discharge Call  Contact Status: Patient Contact Status: Message  Language assistant needed: Interpreter Mode: Telephonic Interpreter Interpreter Name: [name] [phone]        Follow-Up Questions:    Dimple Francis Postnatal Depression Scale:  In the Past 7 Days:    PHQ2-9 Depression Scale:     Discharge Follow-up:    Post-discharge interventions: NA  Pearlie Bougie, RN 05/13/2023 14:11

## 2024-01-11 IMAGING — MG DIGITAL DIAGNOSTIC BILAT W/ TOMO W/ CAD
8 of 12 series · 8 of 28 positions shown · non-contrast
Comparison: Previous exam(s).

CLINICAL DATA: Follow-up for bilateral probably benign breast
calcifications. Patient initially presented on 02/25/2020 with a
left breast lump, with associated tenderness, waxing waning with her
menstrual. Other than a 5 mm left breast retroareolar cyst in
bilateral probably benign calcifications, imaging was unremarkable.

EXAM:
DIGITAL DIAGNOSTIC BILATERAL MAMMOGRAM WITH TOMOSYNTHESIS AND CAD
TECHNIQUE: Bilateral digital diagnostic mammography and breast tomosynthesis
was performed. The images were evaluated with computer-aided
detection.

[L ML]
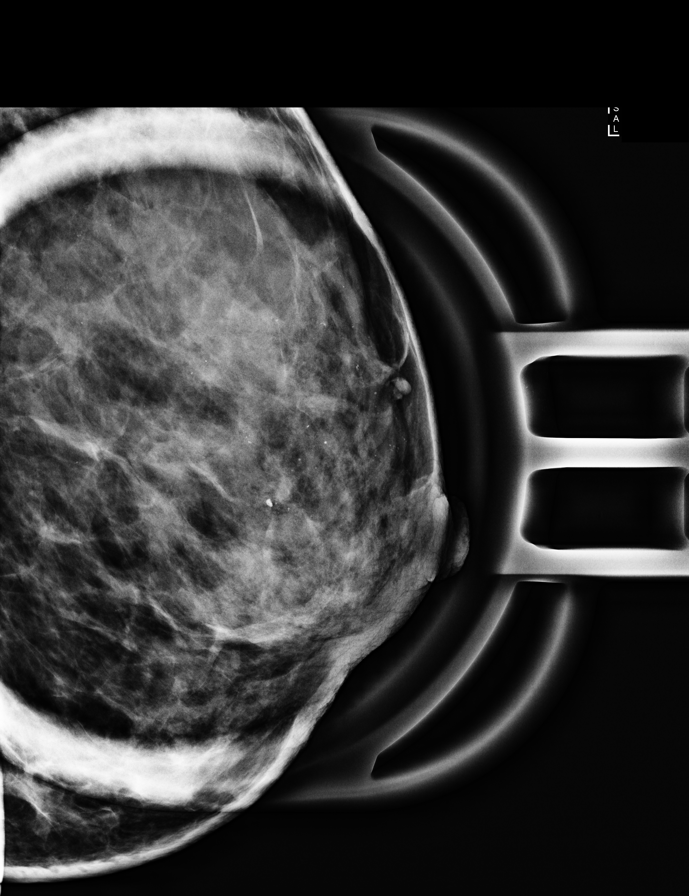

[R ML]
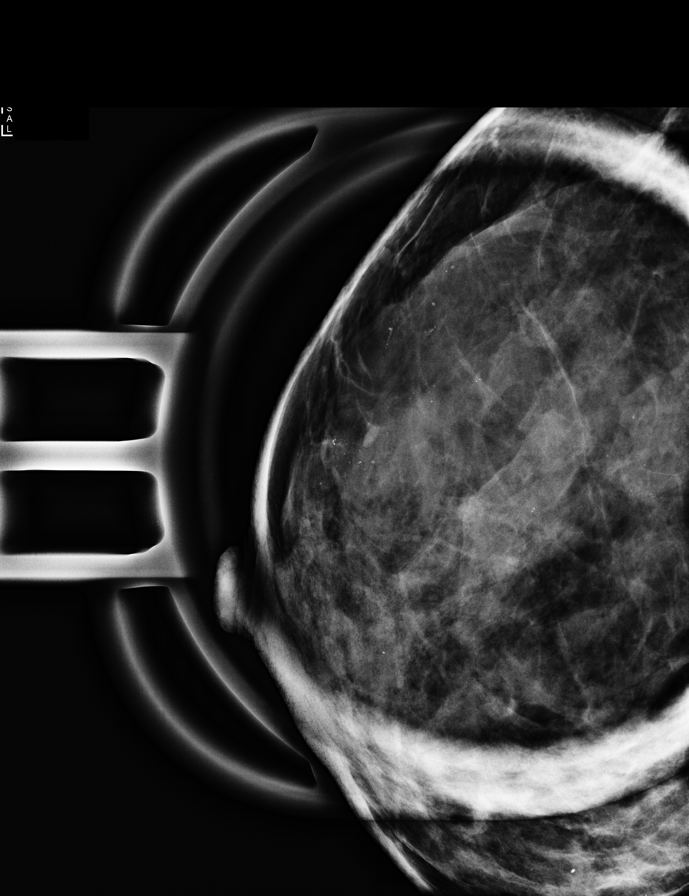

[L CC]
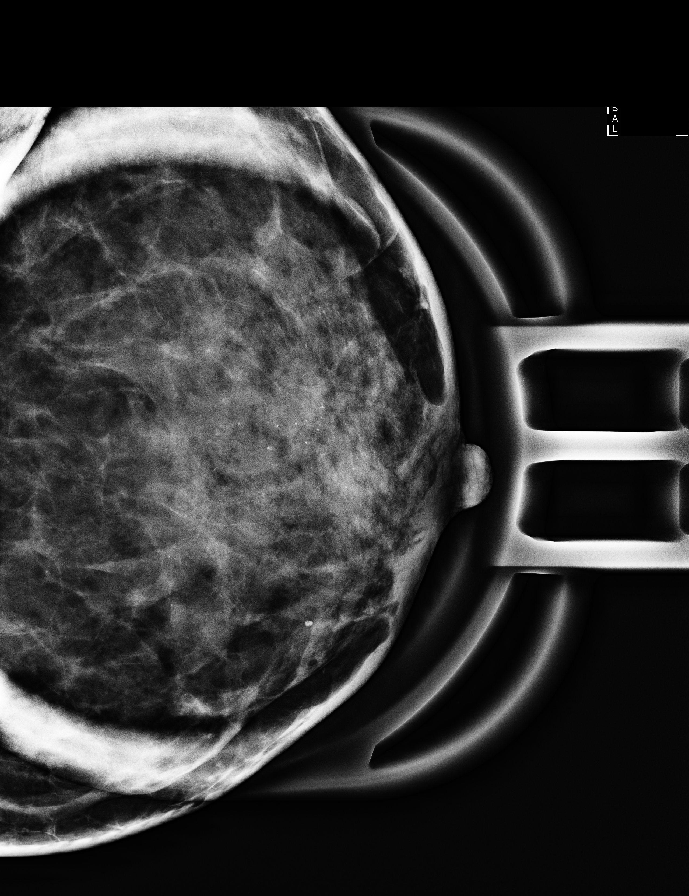

[R CC]
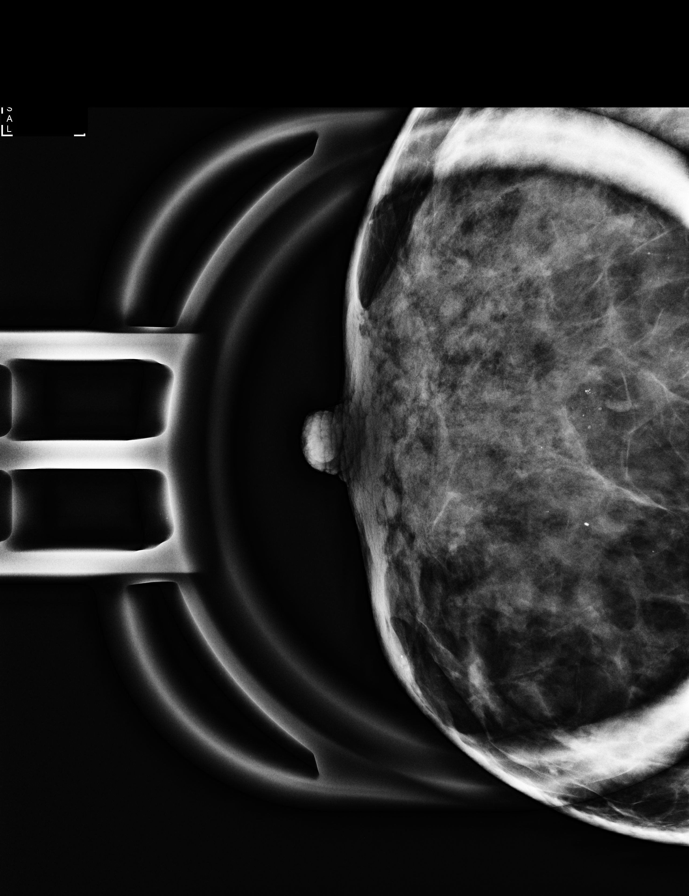

[L CC synth-2D]
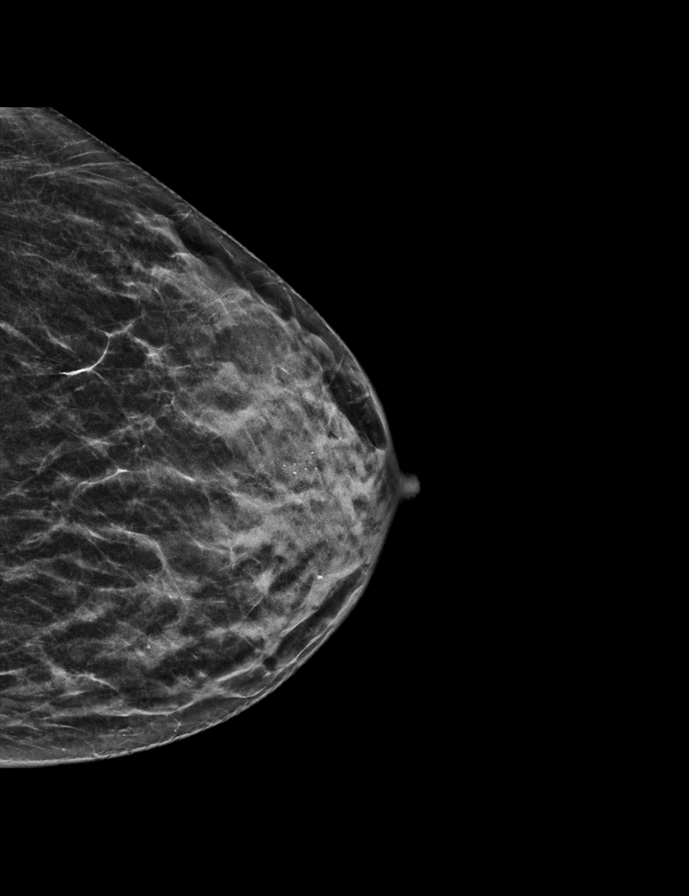

[L MLO synth-2D]
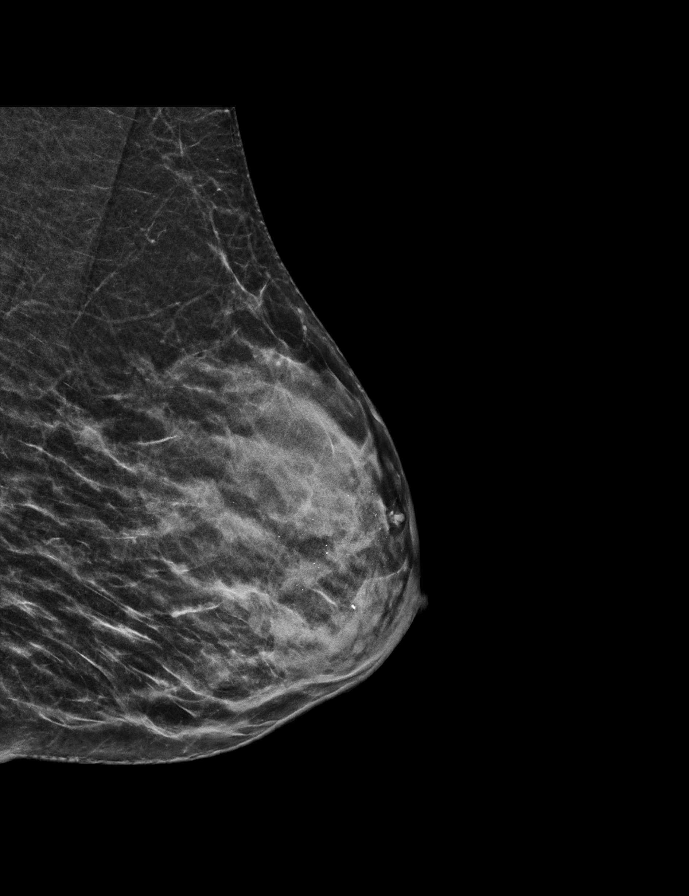

[R MLO synth-2D]
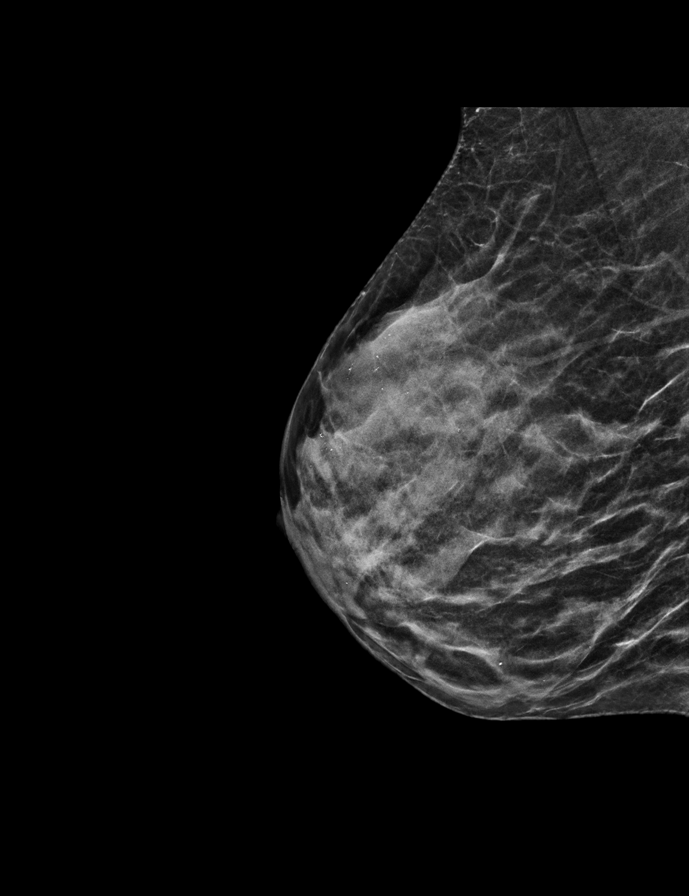

[R CC synth-2D]
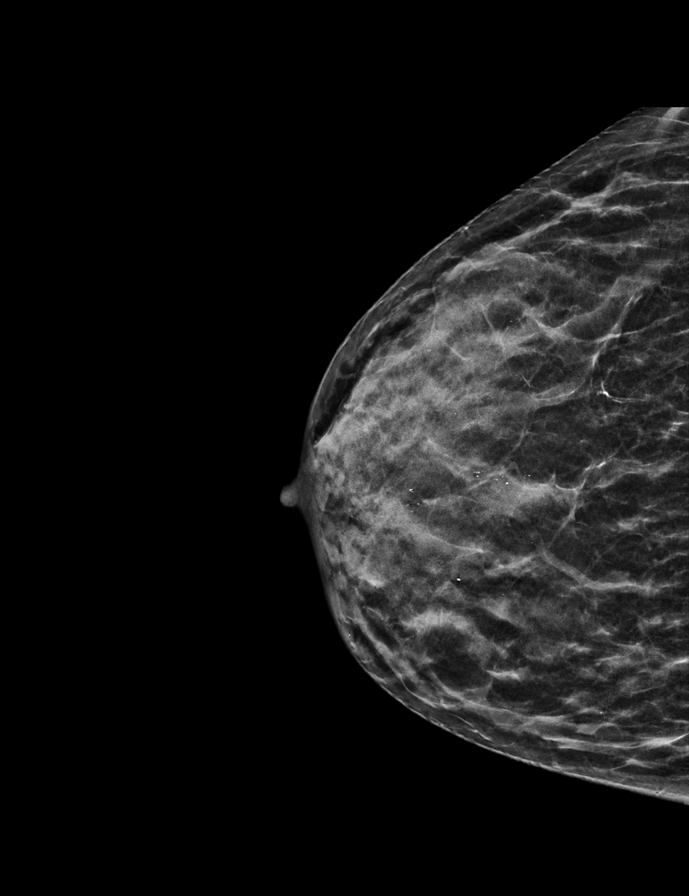

[8 of 28 positions shown; findings below may reference images not displayed]

ACR Breast Density Category d: The breast tissue is extremely dense,
which lowers the sensitivity of mammography.
FINDINGS: There are no masses, areas of architectural distortion, areas of
significant asymmetry or new calcifications.

Probably benign calcifications in each breast are without change.
IMPRESSION: 1. Probably benign bilateral breast calcifications, stable for 1
year.

RECOMMENDATION:
Diagnostic mammography with bilateral breast magnification views in
1 year to complete 2 years of stability for the probably benign
bilateral breast calcifications.

I have discussed the findings and recommendations with the patient.
If applicable, a reminder letter will be sent to the patient
regarding the next appointment.

BI-RADS CATEGORY  3: Probably benign.
